# Patient Record
Sex: Male | Born: 1950 | Race: White | Hispanic: No | Marital: Married | Smoking: Never smoker
Health system: Southern US, Community
[De-identification: ages and names within clinical notes are randomized; demographics above are authoritative.]

## PROBLEM LIST (undated history)

## (undated) DIAGNOSIS — F419 Anxiety disorder, unspecified: Secondary | ICD-10-CM

## (undated) DIAGNOSIS — T7840XA Allergy, unspecified, initial encounter: Secondary | ICD-10-CM

## (undated) DIAGNOSIS — F32A Depression, unspecified: Secondary | ICD-10-CM

## (undated) DIAGNOSIS — F329 Major depressive disorder, single episode, unspecified: Secondary | ICD-10-CM

## (undated) DIAGNOSIS — M1990 Unspecified osteoarthritis, unspecified site: Secondary | ICD-10-CM

## (undated) HISTORY — DX: Allergy, unspecified, initial encounter: T78.40XA

## (undated) HISTORY — PX: COLONOSCOPY: SHX174

## (undated) HISTORY — DX: Unspecified osteoarthritis, unspecified site: M19.90

## (undated) HISTORY — DX: Anxiety disorder, unspecified: F41.9

## (undated) HISTORY — DX: Major depressive disorder, single episode, unspecified: F32.9

## (undated) HISTORY — DX: Depression, unspecified: F32.A

---

## 2000-09-03 ENCOUNTER — Encounter: Admission: RE | Admit: 2000-09-03 | Discharge: 2000-09-03 | Payer: Self-pay | Admitting: Family Medicine

## 2000-09-03 ENCOUNTER — Encounter: Payer: Self-pay | Admitting: Family Medicine

## 2006-08-02 ENCOUNTER — Encounter: Admission: RE | Admit: 2006-08-02 | Discharge: 2006-08-02 | Payer: Self-pay | Admitting: Family Medicine

## 2006-08-02 IMAGING — CR DG CHEST 2V
2 series · 2 of 2 positions shown · non-contrast
Comparison: Report of a prior chest x-ray [DATE].  The images are no longer available.

CLINICAL DATA: Cough/sore throat. 
 CHEST - 2 VIEW:

[view not recorded (1 of 2)]
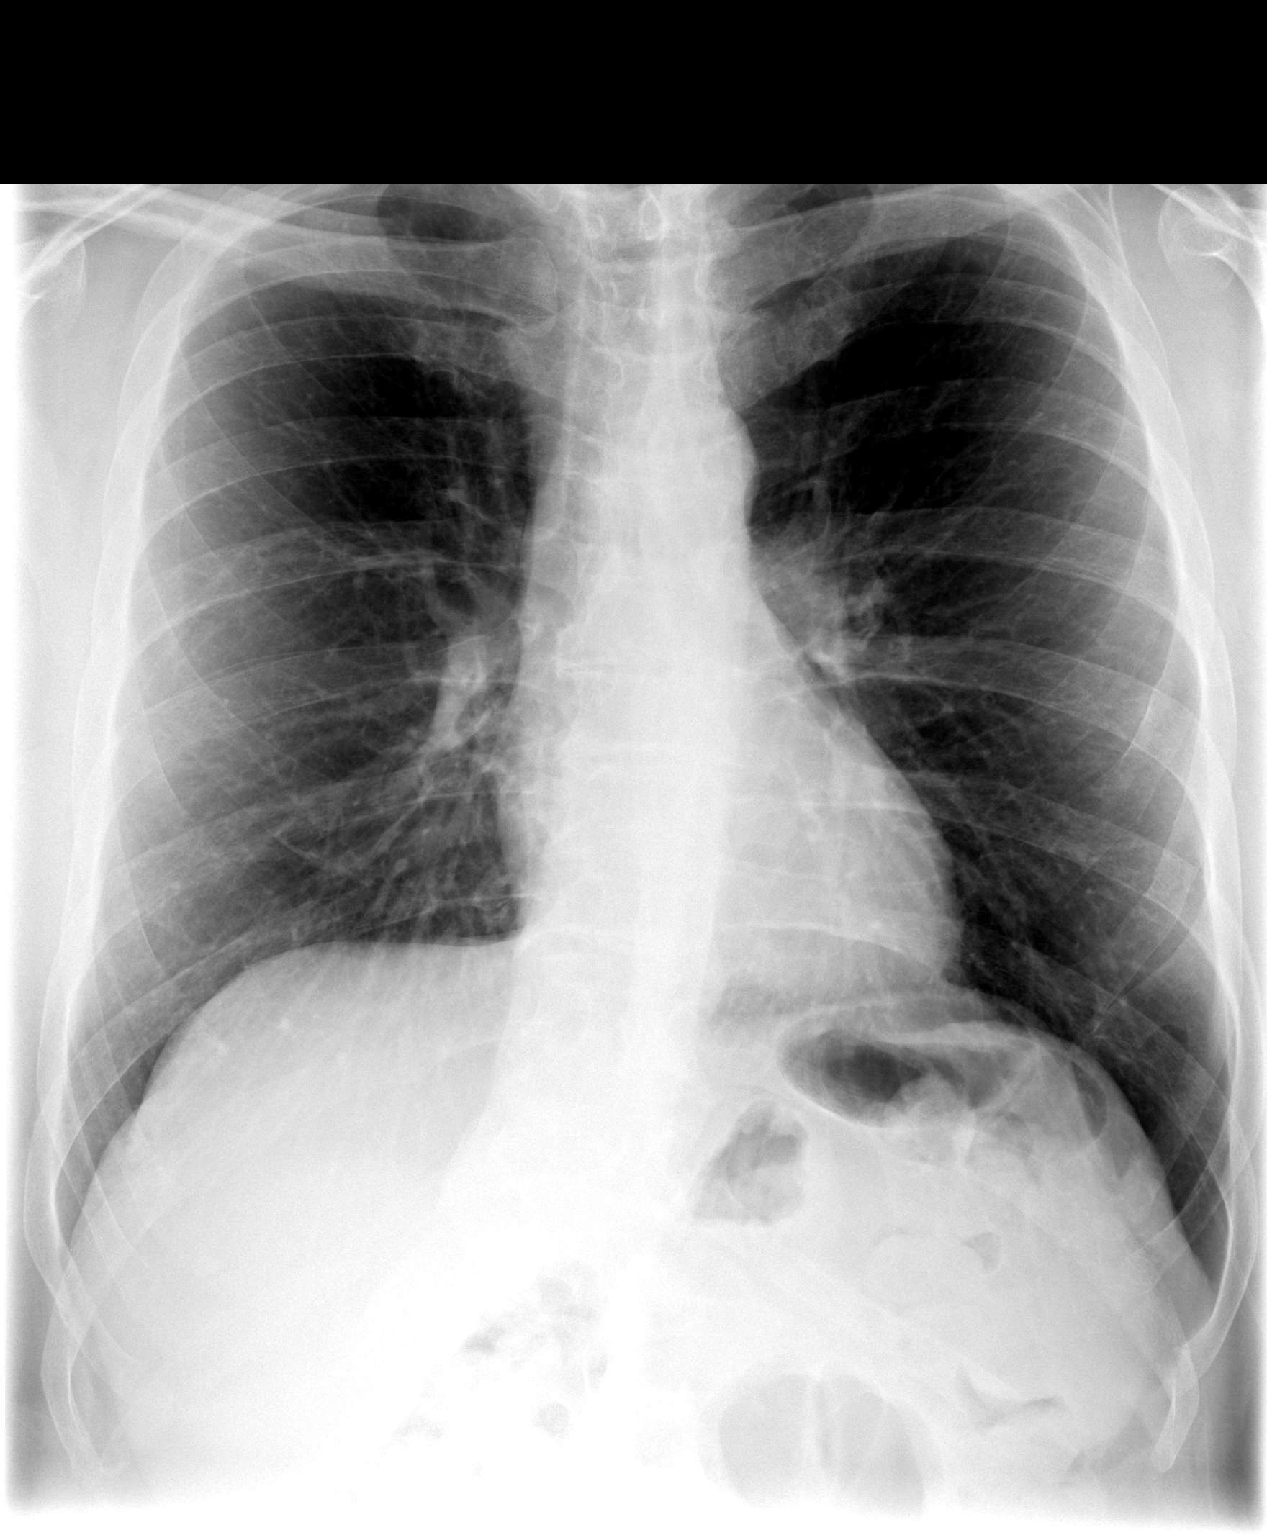

[view not recorded (2 of 2)]
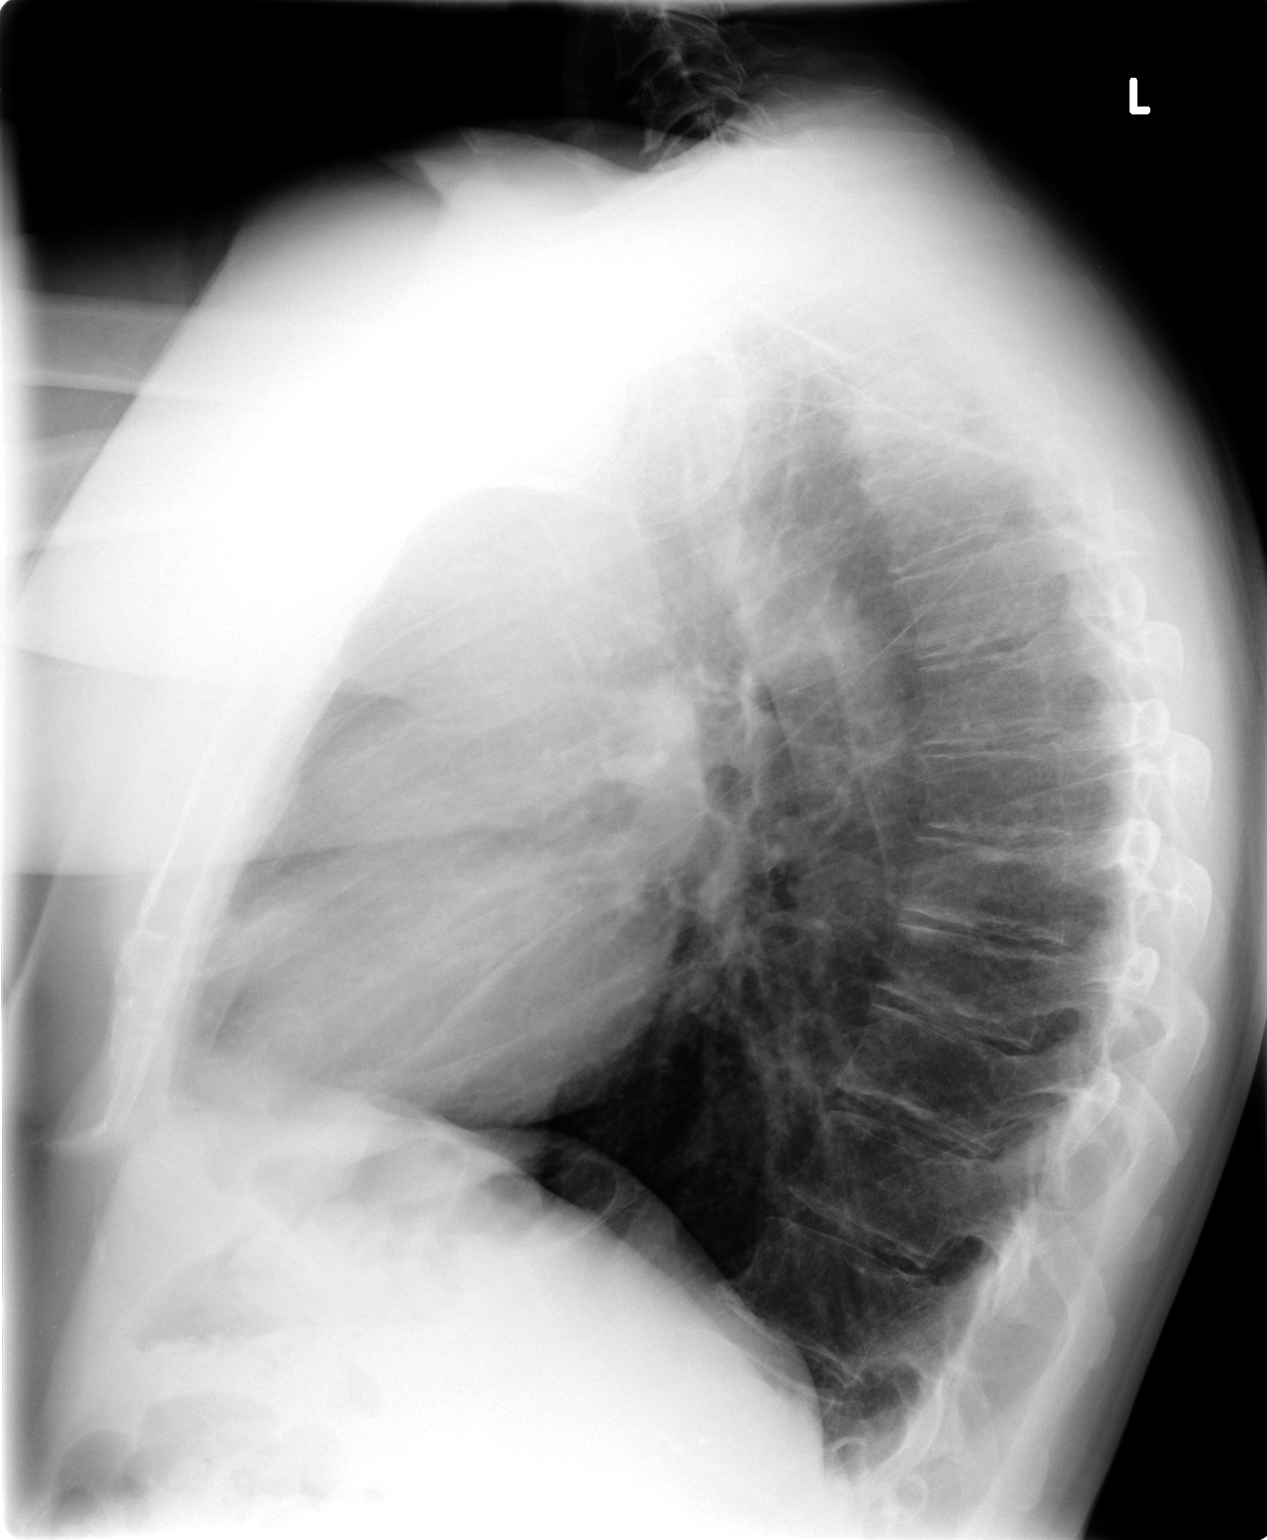

[2 of 2 positions shown; findings below may reference images not displayed]

FINDINGS: Heart and lungs normal.  Slight peribronchial thickening.  Moderate thoracolumbar scoliosis.
IMPRESSION: No active disease.

## 2009-05-10 ENCOUNTER — Encounter (INDEPENDENT_AMBULATORY_CARE_PROVIDER_SITE_OTHER): Payer: Self-pay | Admitting: *Deleted

## 2009-06-15 ENCOUNTER — Encounter (INDEPENDENT_AMBULATORY_CARE_PROVIDER_SITE_OTHER): Payer: Self-pay

## 2009-06-21 ENCOUNTER — Ambulatory Visit: Payer: Self-pay | Admitting: Gastroenterology

## 2009-07-04 ENCOUNTER — Ambulatory Visit: Payer: Self-pay | Admitting: Gastroenterology

## 2010-07-18 NOTE — Letter (Signed)
Summary: Previsit letter  [facility]  [address] [place], [place] [address]   Phone: [phone]  Fax: [phone]       05/10/2009 MRN: [id number]  Philip Baker [address] [place], [place]  [address]  Dear [name],  Welcome to the Gastroenterology Division at [facility].    You are scheduled to see a nurse for your pre-procedure visit on 06/21/2009 at 8:00AM on the 3rd floor at [facility], [address]  We ask that you try to arrive at our office 15 minutes prior to your appointment time to allow for check-in.  Your nurse visit will consist of discussing your medical and surgical history, your immediate family medical history, and your medications.    Please bring a complete list of all your medications or, if you prefer, bring the medication bottles and we will list them.  We will need to be aware of both prescribed and over the counter drugs.  We will need to know exact dosage information as well.  If you are on blood thinners (Coumadin, Plavix, Aggrenox, Ticlid, etc.) please call our office today/prior to your appointment, as we need to consult with your physician about holding your medication.   Please be prepared to read and sign documents such as consent forms, a financial agreement, and acknowledgement forms.  If necessary, and with your consent, a friend or relative is welcome to sit-in on the nurse visit with you.  Please bring your insurance card so that we may make a copy of it.  If your insurance requires a referral to see a specialist, please bring your referral form from your primary care physician.  No co-pay is required for this nurse visit.     If you cannot keep your appointment, please call [phone] to cancel or reschedule prior to your appointment date.  This allows [place] the opportunity to schedule an appointment for another patient in need of care.    Thank you for choosing [facility] for your medical needs.   We appreciate the opportunity to care for you.  Please visit [place] at our website  to learn more about our practice.                     Sincerely.                                                                                                                   The Gastroenterology Division

## 2010-07-18 NOTE — Miscellaneous (Signed)
Summary: Lec previsit  Clinical Lists Changes  Medications: Added new medication of MOVIPREP 100 GM  SOLR (PEG-KCL-NACL-NASULF-NA ASC-C) As per prep instructions. - Signed Rx of MOVIPREP 100 GM  SOLR (PEG-KCL-NACL-NASULF-NA ASC-C) As per prep instructions.;  #1 x 0;  Signed;  Entered by: Ulis Rias RN;  Authorized by: Mardella Layman MD [facility];  Method used: Electronically to [facility] [address] [place], [place], [place]  [address], Ph: [phone], Fax: [phone] Observations: Added new observation of NKA: T (06/21/2009 7:44)    Prescriptions: MOVIPREP 100 GM  SOLR (PEG-KCL-NACL-NASULF-NA ASC-C) As per prep instructions.  #1 x 0   Entered by:   Ulis Rias RN   Authorized by:   Mardella Layman MD [facility]   Signed by:   Ulis Rias RN on 06/21/2009   Method used:   Electronically to        [facility]       [address]       [place], [place]  [address]       Ph: [phone]       Fax: [phone]   RxID:   [id number]

## 2010-07-18 NOTE — Letter (Signed)
Summary: [facility] Instructions  [facility]  [address] [place], [place] [address]   Phone: [phone]  Fax: [phone]       Philip Baker    [birth year]    MRN: [id number]        Procedure Day /Date:  Monday 07-04-2009     Arrival Time: 8:00 am      Procedure Time:  9:00 am     Location of Procedure:                    _x _  [facility] (4th Floor)   PREPARATION FOR COLONOSCOPY WITH MOVIPREP   Starting 5 days prior to your procedure Wednesday 1-12 do not eat nuts, seeds, popcorn, corn, beans, peas,  salads, or any raw vegetables.  Do not take any fiber supplements (e.g. Metamucil, Citrucel, and Benefiber).  THE DAY BEFORE YOUR PROCEDURE         DATE: Sunday 1-16 1.  Drink clear liquids the entire day-NO SOLID FOOD  2.  Do not drink anything colored red or purple.  Avoid juices with pulp.  No orange juice.  3.  Drink at least 64 oz. (8 glasses) of fluid/clear liquids during the day to prevent dehydration and help the prep work efficiently.  CLEAR LIQUIDS INCLUDE: Water Jello Ice Popsicles Tea (sugar ok, no milk/cream) Powdered fruit flavored drinks Coffee (sugar ok, no milk/cream) Gatorade Juice: apple, white grape, white cranberry  Lemonade Clear bullion, consomm, broth Carbonated beverages (any kind) Strained chicken noodle soup Hard Candy                             4.  In the morning, mix first dose of MoviPrep solution:    Empty 1 Pouch A and 1 Pouch B into the disposable container    Add lukewarm drinking water to the top line of the container. Mix to dissolve    Refrigerate (mixed solution should be used within 24 hrs)  5.  Begin drinking the prep at 5:00 p.m. The MoviPrep container is divided by 4 marks.   Every 15 minutes drink the solution down to the next mark (approximately 8 oz) until the full liter is complete.   6.  Follow completed prep with 16 oz of clear liquid of your choice (Nothing red or purple).  Continue  to drink clear liquids until bedtime.  7.  Before going to bed, mix second dose of MoviPrep solution:    Empty 1 Pouch A and 1 Pouch B into the disposable container    Add lukewarm drinking water to the top line of the container. Mix to dissolve    Refrigerate  THE DAY OF YOUR PROCEDURE      DATE: Monday 1-17  Beginning at 4:00 am (5 hours before procedure):         1. Every 15 minutes, drink the solution down to the next mark (approx 8 oz) until the full liter is complete.  2. Follow completed prep with 16 oz. of clear liquid of your choice.    3. You may drink clear liquids until 7:00 am (2 HOURS BEFORE PROCEDURE).   MEDICATION INSTRUCTIONS  Unless otherwise instructed, you should take regular prescription medications with a small sip of water   as early as possible the morning of your procedure.         OTHER INSTRUCTIONS  You will need a responsible adult at least  62 years of age to accompany you and drive you home.   This person must remain in the waiting room during your procedure.  Wear loose fitting clothing that is easily removed.  Leave jewelry and other valuables at home.  However, you may wish to bring a book to read or  an iPod/MP3 player to listen to music as you wait for your procedure to start.  Remove all body piercing jewelry and leave at home.  Total time from sign-in until discharge is approximately 2-3 hours.  You should go home directly after your procedure and rest.  You can resume normal activities the  day after your procedure.  The day of your procedure you should not:   Drive   Make legal decisions   Operate machinery   Drink alcohol   Return to work  You will receive specific instructions about eating, activities and medications before you leave.    The above instructions have been reviewed and explained to me by   Ulis Rias RN  June 21, 2009 7:57 AM     I fully understand and can verbalize these instructions  _____________________________ Date _________

## 2010-07-18 NOTE — Procedures (Signed)
Summary: Colonoscopy  Patient: Philip Baker Note: All result statuses are Final unless otherwise noted.  Tests: (1) Colonoscopy (COL)   COL Colonoscopy           DONE      [facility]     [address]     [place], [place]  [address]           COLONOSCOPY PROCEDURE REPORT           PATIENT:  Philip Baker, Philip Baker  MR#:  [id number]     BIRTHDATE:  February 07, 1951, 58 yrs. old  GENDER:  male           ENDOSCOPIST:  Kentaro Alewine. Jarold Motto, MD, [facility]     Referred by:  Chilton Greathouse, M.D.           PROCEDURE DATE:  07/04/2009     PROCEDURE:  Colonoscopy, Diagnostic     ASA CLASS:  Class II     INDICATIONS:  Routine Risk Screening           MEDICATIONS:   Fentanyl 50 mcg IV, Versed 7 mg IV           DESCRIPTION OF PROCEDURE:   After the risks benefits and     alternatives of the procedure were thoroughly explained, informed     consent was obtained.  Digital rectal exam was performed and     revealed no abnormalities.   The LB CF-[id number] endoscope     was introduced through the anus and advanced to the cecum, which     was identified by both the appendix and ileocecal valve, without     limitations.  The quality of the prep was excellent, using     MoviPrep.  The instrument was then slowly withdrawn as the colon     was fully examined.     <<PROCEDUREIMAGES>>           FINDINGS:  No polyps or cancers were seen.  This was otherwise a     normal examination of the colon.   Retroflexed views in the rectum     revealed no abnormalities.    The scope was then withdrawn from     the patient and the procedure completed.           COMPLICATIONS:  None           ENDOSCOPIC IMPRESSION:     1) No polyps or cancers     2) Otherwise normal examination     RECOMMENDATIONS:     1) Continue current colorectal screening recommendations for     "routine risk" patients with a repeat colonoscopy in 10 years.           REPEAT EXAM:  No           ______________________________     Vania Rea.  Jarold Motto, MD, [facility]           CC:           n.     eSIGNED:   Vania Rea. Malea Swilling at 07/04/2009 09:29 AM           [place] Shi, [id number]  Note: An exclamation mark (!) indicates a result that was not dispersed into the flowsheet. Document Creation Date: 07/04/2009 9:26 AM _______________________________________________________________________  (1) Order result status: Final Collection or observation date-time: 07/04/2009 09:23 Requested date-time:  Receipt date-time:  Reported date-time:  Referring Physician:   Ordering Physician: Sheryn Bison [phone]) Specimen Source:  Source: EndoProS  Filler Order Number: [id number] Lab site:   Appended Document: Colonoscopy    Clinical Lists Changes  Observations: Added new observation of COLONNXTDUE: 06/2019 (07/04/2009 15:36)      Appended Document: Colonoscopy     Procedures Next Due Date:    Colonoscopy: 07/2019

## 2012-05-03 ENCOUNTER — Ambulatory Visit (INDEPENDENT_AMBULATORY_CARE_PROVIDER_SITE_OTHER): Payer: BC Managed Care – PPO | Admitting: Physician Assistant

## 2012-05-03 VITALS — BP 123/72 | HR 65 | Temp 98.7°F | Resp 16 | Ht 71.0 in | Wt 192.0 lb

## 2012-05-03 DIAGNOSIS — J029 Acute pharyngitis, unspecified: Secondary | ICD-10-CM

## 2012-05-03 DIAGNOSIS — J329 Chronic sinusitis, unspecified: Secondary | ICD-10-CM

## 2012-05-03 MED ORDER — FLUTICASONE PROPIONATE 50 MCG/ACT NA SUSP: 2.0000 | Freq: Every day | NASAL | Status: DC

## 2012-05-03 MED ORDER — FIRST-DUKES MOUTHWASH MT SUSP: 5.0000 mL | Freq: Two times a day (BID) | OROMUCOSAL | Status: DC | PRN

## 2012-05-03 MED ORDER — AZITHROMYCIN 250 MG PO TABS: ORAL_TABLET | ORAL | Status: DC

## 2012-05-03 NOTE — Progress Notes (Signed)
  Subjective:    Patient ID: Philip Baker, male    DOB: [birth year], 61 y.o.   MRN: [id number]  HPI 61 year old male presents with 1 week history of nasal congestion, postnasal drainage, and progressively worsening sore throat. Has had some chills but no documented fever.  Denies otalgia, nausea, vomiting, headache, sinus pain, shortness of breath, or wheezing.  He has no other complaints today. He has been taking Claritin which has not seemed to help much.  His wife, sister-in-law, and brother-in-law all have similar illnesses that have resolved, but his has persisted.     Review of Systems  Constitutional: Positive for chills. Negative for fever.  HENT: Positive for congestion, sore throat, rhinorrhea and postnasal drip. Negative for neck pain.   Respiratory: Negative for cough, shortness of breath and wheezing.   Gastrointestinal: Negative for nausea and vomiting.  Skin: Negative for rash.  Neurological: Negative for headaches.  All other systems reviewed and are negative.       Objective:   Physical Exam  Constitutional: He is oriented to person, place, and time. He appears well-developed and well-nourished.  HENT:  Head: Normocephalic and atraumatic.  Right Ear: External ear normal.  Left Ear: External ear normal.  Mouth/Throat: Oropharynx is clear and moist. No oropharyngeal exudate (slight tonsillar erythema and clear postnasal drainage).  Eyes: Conjunctivae normal are normal.  Neck: Normal range of motion.  Cardiovascular: Normal rate, regular rhythm and normal heart sounds.   Pulmonary/Chest: Effort normal and breath sounds normal.  Lymphadenopathy:    He has no cervical adenopathy.  Neurological: He is alert and oriented to person, place, and time.  Psychiatric: He has a normal mood and affect. His behavior is normal. Judgment and thought content normal.          Assessment & Plan:   1. Sinusitis  azithromycin (ZITHROMAX) 250 MG tablet, fluticasone (FLONASE) 50  MCG/ACT nasal spray  2. Acute pharyngitis  Diphenhyd-Hydrocort-Nystatin (FIRST-DUKES MOUTHWASH) SUSP  Continue Claritin daily Start Zpack OTC Delsym prn cough Duke's mouthwash to help with throat pain Follow up if symptoms worsen or fail to improve

## 2014-01-04 DIAGNOSIS — Z85828 Personal history of other malignant neoplasm of skin: Secondary | ICD-10-CM | POA: Diagnosis not present

## 2014-01-04 DIAGNOSIS — M7130 Other bursal cyst, unspecified site: Secondary | ICD-10-CM | POA: Diagnosis not present

## 2014-01-04 DIAGNOSIS — L570 Actinic keratosis: Secondary | ICD-10-CM | POA: Diagnosis not present

## 2014-01-04 DIAGNOSIS — L821 Other seborrheic keratosis: Secondary | ICD-10-CM | POA: Diagnosis not present

## 2014-01-04 DIAGNOSIS — L819 Disorder of pigmentation, unspecified: Secondary | ICD-10-CM | POA: Diagnosis not present

## 2014-02-23 DIAGNOSIS — H6120 Impacted cerumen, unspecified ear: Secondary | ICD-10-CM | POA: Diagnosis not present

## 2014-03-19 DIAGNOSIS — Z23 Encounter for immunization: Secondary | ICD-10-CM | POA: Diagnosis not present

## 2014-05-26 DIAGNOSIS — E785 Hyperlipidemia, unspecified: Secondary | ICD-10-CM | POA: Diagnosis not present

## 2014-05-26 DIAGNOSIS — Z125 Encounter for screening for malignant neoplasm of prostate: Secondary | ICD-10-CM | POA: Diagnosis not present

## 2014-06-03 DIAGNOSIS — E785 Hyperlipidemia, unspecified: Secondary | ICD-10-CM | POA: Diagnosis not present

## 2014-06-03 DIAGNOSIS — Z6826 Body mass index (BMI) 26.0-26.9, adult: Secondary | ICD-10-CM | POA: Diagnosis not present

## 2014-06-03 DIAGNOSIS — Z23 Encounter for immunization: Secondary | ICD-10-CM | POA: Diagnosis not present

## 2014-06-03 DIAGNOSIS — Z1389 Encounter for screening for other disorder: Secondary | ICD-10-CM | POA: Diagnosis not present

## 2014-06-03 DIAGNOSIS — F329 Major depressive disorder, single episode, unspecified: Secondary | ICD-10-CM | POA: Diagnosis not present

## 2014-06-03 DIAGNOSIS — J309 Allergic rhinitis, unspecified: Secondary | ICD-10-CM | POA: Diagnosis not present

## 2014-06-03 DIAGNOSIS — Z1212 Encounter for screening for malignant neoplasm of rectum: Secondary | ICD-10-CM | POA: Diagnosis not present

## 2014-06-03 DIAGNOSIS — Z008 Encounter for other general examination: Secondary | ICD-10-CM | POA: Diagnosis not present

## 2014-09-21 DIAGNOSIS — Z6826 Body mass index (BMI) 26.0-26.9, adult: Secondary | ICD-10-CM | POA: Diagnosis not present

## 2014-09-21 DIAGNOSIS — M519 Unspecified thoracic, thoracolumbar and lumbosacral intervertebral disc disorder: Secondary | ICD-10-CM | POA: Diagnosis not present

## 2014-09-21 DIAGNOSIS — M47819 Spondylosis without myelopathy or radiculopathy, site unspecified: Secondary | ICD-10-CM | POA: Diagnosis not present

## 2014-09-21 DIAGNOSIS — M545 Low back pain: Secondary | ICD-10-CM | POA: Diagnosis not present

## 2015-03-05 DIAGNOSIS — Z23 Encounter for immunization: Secondary | ICD-10-CM | POA: Diagnosis not present

## 2015-04-20 ENCOUNTER — Encounter: Payer: Self-pay | Admitting: Gastroenterology

## 2015-06-01 DIAGNOSIS — M47816 Spondylosis without myelopathy or radiculopathy, lumbar region: Secondary | ICD-10-CM | POA: Diagnosis not present

## 2015-06-01 DIAGNOSIS — M4126 Other idiopathic scoliosis, lumbar region: Secondary | ICD-10-CM | POA: Diagnosis not present

## 2015-06-09 DIAGNOSIS — M545 Low back pain: Secondary | ICD-10-CM | POA: Diagnosis not present

## 2015-06-09 DIAGNOSIS — M6281 Muscle weakness (generalized): Secondary | ICD-10-CM | POA: Diagnosis not present

## 2015-06-15 DIAGNOSIS — M545 Low back pain: Secondary | ICD-10-CM | POA: Diagnosis not present

## 2015-06-15 DIAGNOSIS — M6281 Muscle weakness (generalized): Secondary | ICD-10-CM | POA: Diagnosis not present

## 2015-06-22 DIAGNOSIS — M545 Low back pain: Secondary | ICD-10-CM | POA: Diagnosis not present

## 2015-06-22 DIAGNOSIS — M6281 Muscle weakness (generalized): Secondary | ICD-10-CM | POA: Diagnosis not present

## 2015-06-24 DIAGNOSIS — M6281 Muscle weakness (generalized): Secondary | ICD-10-CM | POA: Diagnosis not present

## 2015-06-24 DIAGNOSIS — M545 Low back pain: Secondary | ICD-10-CM | POA: Diagnosis not present

## 2015-06-27 DIAGNOSIS — M545 Low back pain: Secondary | ICD-10-CM | POA: Diagnosis not present

## 2015-06-27 DIAGNOSIS — M6281 Muscle weakness (generalized): Secondary | ICD-10-CM | POA: Diagnosis not present

## 2015-06-28 DIAGNOSIS — D72819 Decreased white blood cell count, unspecified: Secondary | ICD-10-CM | POA: Diagnosis not present

## 2015-06-28 DIAGNOSIS — E784 Other hyperlipidemia: Secondary | ICD-10-CM | POA: Diagnosis not present

## 2015-06-28 DIAGNOSIS — Z125 Encounter for screening for malignant neoplasm of prostate: Secondary | ICD-10-CM | POA: Diagnosis not present

## 2015-06-29 DIAGNOSIS — M6281 Muscle weakness (generalized): Secondary | ICD-10-CM | POA: Diagnosis not present

## 2015-06-29 DIAGNOSIS — M545 Low back pain: Secondary | ICD-10-CM | POA: Diagnosis not present

## 2015-07-04 DIAGNOSIS — Z6826 Body mass index (BMI) 26.0-26.9, adult: Secondary | ICD-10-CM | POA: Diagnosis not present

## 2015-07-04 DIAGNOSIS — F329 Major depressive disorder, single episode, unspecified: Secondary | ICD-10-CM | POA: Diagnosis not present

## 2015-07-04 DIAGNOSIS — Z23 Encounter for immunization: Secondary | ICD-10-CM | POA: Diagnosis not present

## 2015-07-04 DIAGNOSIS — Z0000 Encounter for general adult medical examination without abnormal findings: Secondary | ICD-10-CM | POA: Diagnosis not present

## 2015-07-04 DIAGNOSIS — J309 Allergic rhinitis, unspecified: Secondary | ICD-10-CM | POA: Diagnosis not present

## 2015-07-04 DIAGNOSIS — N529 Male erectile dysfunction, unspecified: Secondary | ICD-10-CM | POA: Diagnosis not present

## 2015-07-04 DIAGNOSIS — M545 Low back pain: Secondary | ICD-10-CM | POA: Diagnosis not present

## 2015-07-04 DIAGNOSIS — Z1389 Encounter for screening for other disorder: Secondary | ICD-10-CM | POA: Diagnosis not present

## 2015-07-04 DIAGNOSIS — Z1212 Encounter for screening for malignant neoplasm of rectum: Secondary | ICD-10-CM | POA: Diagnosis not present

## 2015-07-04 DIAGNOSIS — E784 Other hyperlipidemia: Secondary | ICD-10-CM | POA: Diagnosis not present

## 2015-07-06 DIAGNOSIS — M545 Low back pain: Secondary | ICD-10-CM | POA: Diagnosis not present

## 2015-07-06 DIAGNOSIS — M6281 Muscle weakness (generalized): Secondary | ICD-10-CM | POA: Diagnosis not present

## 2015-07-07 DIAGNOSIS — H2513 Age-related nuclear cataract, bilateral: Secondary | ICD-10-CM | POA: Diagnosis not present

## 2015-07-11 DIAGNOSIS — M545 Low back pain: Secondary | ICD-10-CM | POA: Diagnosis not present

## 2015-07-11 DIAGNOSIS — M6281 Muscle weakness (generalized): Secondary | ICD-10-CM | POA: Diagnosis not present

## 2015-07-14 DIAGNOSIS — M4126 Other idiopathic scoliosis, lumbar region: Secondary | ICD-10-CM | POA: Diagnosis not present

## 2015-07-14 DIAGNOSIS — M47816 Spondylosis without myelopathy or radiculopathy, lumbar region: Secondary | ICD-10-CM | POA: Diagnosis not present

## 2015-07-20 DIAGNOSIS — M6281 Muscle weakness (generalized): Secondary | ICD-10-CM | POA: Diagnosis not present

## 2015-07-20 DIAGNOSIS — M545 Low back pain: Secondary | ICD-10-CM | POA: Diagnosis not present

## 2015-07-29 DIAGNOSIS — M545 Low back pain: Secondary | ICD-10-CM | POA: Diagnosis not present

## 2015-07-29 DIAGNOSIS — M6281 Muscle weakness (generalized): Secondary | ICD-10-CM | POA: Diagnosis not present

## 2015-09-11 DIAGNOSIS — H6503 Acute serous otitis media, bilateral: Secondary | ICD-10-CM | POA: Diagnosis not present

## 2015-12-05 DIAGNOSIS — M25529 Pain in unspecified elbow: Secondary | ICD-10-CM | POA: Diagnosis not present

## 2015-12-05 DIAGNOSIS — Z6825 Body mass index (BMI) 25.0-25.9, adult: Secondary | ICD-10-CM | POA: Diagnosis not present

## 2015-12-12 DIAGNOSIS — M19021 Primary osteoarthritis, right elbow: Secondary | ICD-10-CM | POA: Diagnosis not present

## 2015-12-12 DIAGNOSIS — M25521 Pain in right elbow: Secondary | ICD-10-CM | POA: Diagnosis not present

## 2016-03-03 DIAGNOSIS — Z23 Encounter for immunization: Secondary | ICD-10-CM | POA: Diagnosis not present

## 2016-06-29 DIAGNOSIS — E784 Other hyperlipidemia: Secondary | ICD-10-CM | POA: Diagnosis not present

## 2016-06-29 DIAGNOSIS — Z125 Encounter for screening for malignant neoplasm of prostate: Secondary | ICD-10-CM | POA: Diagnosis not present

## 2016-06-29 DIAGNOSIS — Z0000 Encounter for general adult medical examination without abnormal findings: Secondary | ICD-10-CM | POA: Diagnosis not present

## 2016-07-06 DIAGNOSIS — E784 Other hyperlipidemia: Secondary | ICD-10-CM | POA: Diagnosis not present

## 2016-07-06 DIAGNOSIS — Z0000 Encounter for general adult medical examination without abnormal findings: Secondary | ICD-10-CM | POA: Diagnosis not present

## 2016-07-06 DIAGNOSIS — J309 Allergic rhinitis, unspecified: Secondary | ICD-10-CM | POA: Diagnosis not present

## 2016-07-06 DIAGNOSIS — F329 Major depressive disorder, single episode, unspecified: Secondary | ICD-10-CM | POA: Diagnosis not present

## 2016-07-06 DIAGNOSIS — M545 Low back pain: Secondary | ICD-10-CM | POA: Diagnosis not present

## 2016-07-06 DIAGNOSIS — N529 Male erectile dysfunction, unspecified: Secondary | ICD-10-CM | POA: Diagnosis not present

## 2016-07-06 DIAGNOSIS — Z6825 Body mass index (BMI) 25.0-25.9, adult: Secondary | ICD-10-CM | POA: Diagnosis not present

## 2016-07-06 DIAGNOSIS — Z1389 Encounter for screening for other disorder: Secondary | ICD-10-CM | POA: Diagnosis not present

## 2016-07-06 DIAGNOSIS — D72819 Decreased white blood cell count, unspecified: Secondary | ICD-10-CM | POA: Diagnosis not present

## 2016-07-06 DIAGNOSIS — M25529 Pain in unspecified elbow: Secondary | ICD-10-CM | POA: Diagnosis not present

## 2016-07-09 DIAGNOSIS — H25042 Posterior subcapsular polar age-related cataract, left eye: Secondary | ICD-10-CM | POA: Diagnosis not present

## 2016-07-23 DIAGNOSIS — Z1212 Encounter for screening for malignant neoplasm of rectum: Secondary | ICD-10-CM | POA: Diagnosis not present

## 2016-10-08 DIAGNOSIS — H43811 Vitreous degeneration, right eye: Secondary | ICD-10-CM | POA: Diagnosis not present

## 2016-10-08 DIAGNOSIS — H2513 Age-related nuclear cataract, bilateral: Secondary | ICD-10-CM | POA: Diagnosis not present

## 2016-10-16 DIAGNOSIS — Z85828 Personal history of other malignant neoplasm of skin: Secondary | ICD-10-CM | POA: Diagnosis not present

## 2016-10-16 DIAGNOSIS — L570 Actinic keratosis: Secondary | ICD-10-CM | POA: Diagnosis not present

## 2016-10-16 DIAGNOSIS — L821 Other seborrheic keratosis: Secondary | ICD-10-CM | POA: Diagnosis not present

## 2016-10-16 DIAGNOSIS — M7130 Other bursal cyst, unspecified site: Secondary | ICD-10-CM | POA: Diagnosis not present

## 2017-03-02 DIAGNOSIS — Z23 Encounter for immunization: Secondary | ICD-10-CM | POA: Diagnosis not present

## 2017-04-08 DIAGNOSIS — H2513 Age-related nuclear cataract, bilateral: Secondary | ICD-10-CM | POA: Diagnosis not present

## 2017-04-08 DIAGNOSIS — H40013 Open angle with borderline findings, low risk, bilateral: Secondary | ICD-10-CM | POA: Diagnosis not present

## 2017-07-15 DIAGNOSIS — R82998 Other abnormal findings in urine: Secondary | ICD-10-CM | POA: Diagnosis not present

## 2017-07-15 DIAGNOSIS — Z125 Encounter for screening for malignant neoplasm of prostate: Secondary | ICD-10-CM | POA: Diagnosis not present

## 2017-07-15 DIAGNOSIS — E7849 Other hyperlipidemia: Secondary | ICD-10-CM | POA: Diagnosis not present

## 2017-07-22 DIAGNOSIS — Z1389 Encounter for screening for other disorder: Secondary | ICD-10-CM | POA: Diagnosis not present

## 2017-07-22 DIAGNOSIS — Z1212 Encounter for screening for malignant neoplasm of rectum: Secondary | ICD-10-CM | POA: Diagnosis not present

## 2017-07-22 DIAGNOSIS — Z6824 Body mass index (BMI) 24.0-24.9, adult: Secondary | ICD-10-CM | POA: Diagnosis not present

## 2017-07-22 DIAGNOSIS — F3289 Other specified depressive episodes: Secondary | ICD-10-CM | POA: Diagnosis not present

## 2017-07-22 DIAGNOSIS — Z0000 Encounter for general adult medical examination without abnormal findings: Secondary | ICD-10-CM | POA: Diagnosis not present

## 2017-07-22 DIAGNOSIS — E7849 Other hyperlipidemia: Secondary | ICD-10-CM | POA: Diagnosis not present

## 2017-07-22 DIAGNOSIS — D72819 Decreased white blood cell count, unspecified: Secondary | ICD-10-CM | POA: Diagnosis not present

## 2017-07-22 DIAGNOSIS — J3089 Other allergic rhinitis: Secondary | ICD-10-CM | POA: Diagnosis not present

## 2017-10-07 DIAGNOSIS — H2513 Age-related nuclear cataract, bilateral: Secondary | ICD-10-CM | POA: Diagnosis not present

## 2017-10-17 DIAGNOSIS — H2513 Age-related nuclear cataract, bilateral: Secondary | ICD-10-CM | POA: Diagnosis not present

## 2017-10-18 DIAGNOSIS — H1013 Acute atopic conjunctivitis, bilateral: Secondary | ICD-10-CM | POA: Diagnosis not present

## 2017-10-29 DIAGNOSIS — H25812 Combined forms of age-related cataract, left eye: Secondary | ICD-10-CM | POA: Diagnosis not present

## 2017-10-29 DIAGNOSIS — H2512 Age-related nuclear cataract, left eye: Secondary | ICD-10-CM | POA: Diagnosis not present

## 2017-12-18 DIAGNOSIS — H2511 Age-related nuclear cataract, right eye: Secondary | ICD-10-CM | POA: Diagnosis not present

## 2018-01-02 DIAGNOSIS — Z961 Presence of intraocular lens: Secondary | ICD-10-CM | POA: Diagnosis not present

## 2018-03-22 DIAGNOSIS — Z23 Encounter for immunization: Secondary | ICD-10-CM | POA: Diagnosis not present

## 2018-03-26 DIAGNOSIS — Z6824 Body mass index (BMI) 24.0-24.9, adult: Secondary | ICD-10-CM | POA: Diagnosis not present

## 2018-03-26 DIAGNOSIS — R05 Cough: Secondary | ICD-10-CM | POA: Diagnosis not present

## 2018-03-26 DIAGNOSIS — J309 Allergic rhinitis, unspecified: Secondary | ICD-10-CM | POA: Diagnosis not present

## 2018-06-25 DIAGNOSIS — H2511 Age-related nuclear cataract, right eye: Secondary | ICD-10-CM | POA: Diagnosis not present

## 2018-08-04 DIAGNOSIS — R82998 Other abnormal findings in urine: Secondary | ICD-10-CM | POA: Diagnosis not present

## 2018-08-04 DIAGNOSIS — E7849 Other hyperlipidemia: Secondary | ICD-10-CM | POA: Diagnosis not present

## 2018-08-04 DIAGNOSIS — Z125 Encounter for screening for malignant neoplasm of prostate: Secondary | ICD-10-CM | POA: Diagnosis not present

## 2018-08-11 DIAGNOSIS — F329 Major depressive disorder, single episode, unspecified: Secondary | ICD-10-CM | POA: Diagnosis not present

## 2018-08-11 DIAGNOSIS — E7849 Other hyperlipidemia: Secondary | ICD-10-CM | POA: Diagnosis not present

## 2018-08-11 DIAGNOSIS — J3089 Other allergic rhinitis: Secondary | ICD-10-CM | POA: Diagnosis not present

## 2018-08-11 DIAGNOSIS — Z6824 Body mass index (BMI) 24.0-24.9, adult: Secondary | ICD-10-CM | POA: Diagnosis not present

## 2018-08-11 DIAGNOSIS — Z0000 Encounter for general adult medical examination without abnormal findings: Secondary | ICD-10-CM | POA: Diagnosis not present

## 2018-08-11 DIAGNOSIS — D72819 Decreased white blood cell count, unspecified: Secondary | ICD-10-CM | POA: Diagnosis not present

## 2018-08-11 DIAGNOSIS — H6123 Impacted cerumen, bilateral: Secondary | ICD-10-CM | POA: Diagnosis not present

## 2018-08-11 DIAGNOSIS — Z1331 Encounter for screening for depression: Secondary | ICD-10-CM | POA: Diagnosis not present

## 2018-08-12 DIAGNOSIS — Z1212 Encounter for screening for malignant neoplasm of rectum: Secondary | ICD-10-CM | POA: Diagnosis not present

## 2019-02-28 DIAGNOSIS — Z23 Encounter for immunization: Secondary | ICD-10-CM | POA: Diagnosis not present

## 2019-07-19 ENCOUNTER — Ambulatory Visit: Payer: Self-pay

## 2019-07-25 ENCOUNTER — Ambulatory Visit: Payer: PPO | Attending: Internal Medicine

## 2019-07-25 DIAGNOSIS — Z23 Encounter for immunization: Secondary | ICD-10-CM

## 2019-07-25 NOTE — Progress Notes (Signed)
   Covid-19 Vaccination Clinic  Name:  Philip Baker    MRN: [id number] DOB: 1950-12-09  07/25/2019  Mr. Franchina was observed post Covid-19 immunization for 15 minutes without incidence. He was provided with Vaccine Information Sheet and instruction to access the V-Safe system.   Mr. Bowder was instructed to call 911 with any severe reactions post vaccine: Marland Kitchen Difficulty breathing  . Swelling of your face and throat  . A fast heartbeat  . A bad rash all over your body  . Dizziness and weakness    Immunizations Administered    Name Date Dose VIS Date Route   Pfizer COVID-19 Vaccine 07/25/2019  8:17 AM 0.3 mL 05/29/2019 Intramuscular   Manufacturer: [place]   Lot: [id number]   [place]: [id number]

## 2019-07-30 ENCOUNTER — Ambulatory Visit: Payer: Self-pay

## 2019-08-18 ENCOUNTER — Ambulatory Visit: Payer: PPO | Attending: Internal Medicine

## 2019-08-18 DIAGNOSIS — Z23 Encounter for immunization: Secondary | ICD-10-CM | POA: Insufficient documentation

## 2019-08-18 NOTE — Progress Notes (Signed)
   Covid-19 Vaccination Clinic  Name:  Philip Baker    MRN: [id number] DOB: 01/15/1951  08/18/2019  [name] was observed post Covid-19 immunization for 15 minutes without incident. He was provided with Vaccine Information Sheet and instruction to access the V-Safe system.   [name] was instructed to call 911 with any severe reactions post vaccine: Marland Kitchen Difficulty breathing  . Swelling of face and throat  . A fast heartbeat  . A bad rash all over body  . Dizziness and weakness   Immunizations Administered    Name Date Dose VIS Date Route   Pfizer COVID-19 Vaccine 08/18/2019  8:30 AM 0.3 mL 05/29/2019 Intramuscular   Manufacturer: [place]   Lot: [id number]   [place]: [id number]

## 2019-08-26 DIAGNOSIS — Z125 Encounter for screening for malignant neoplasm of prostate: Secondary | ICD-10-CM | POA: Diagnosis not present

## 2019-08-26 DIAGNOSIS — E7849 Other hyperlipidemia: Secondary | ICD-10-CM | POA: Diagnosis not present

## 2019-09-02 DIAGNOSIS — Z0000 Encounter for general adult medical examination without abnormal findings: Secondary | ICD-10-CM | POA: Diagnosis not present

## 2019-09-02 DIAGNOSIS — Z1331 Encounter for screening for depression: Secondary | ICD-10-CM | POA: Diagnosis not present

## 2019-09-02 DIAGNOSIS — G4700 Insomnia, unspecified: Secondary | ICD-10-CM | POA: Diagnosis not present

## 2019-09-02 DIAGNOSIS — D72819 Decreased white blood cell count, unspecified: Secondary | ICD-10-CM | POA: Diagnosis not present

## 2019-09-02 DIAGNOSIS — R82998 Other abnormal findings in urine: Secondary | ICD-10-CM | POA: Diagnosis not present

## 2019-09-02 DIAGNOSIS — J309 Allergic rhinitis, unspecified: Secondary | ICD-10-CM | POA: Diagnosis not present

## 2019-09-02 DIAGNOSIS — F329 Major depressive disorder, single episode, unspecified: Secondary | ICD-10-CM | POA: Diagnosis not present

## 2019-09-02 DIAGNOSIS — E785 Hyperlipidemia, unspecified: Secondary | ICD-10-CM | POA: Diagnosis not present

## 2019-09-11 DIAGNOSIS — Z1212 Encounter for screening for malignant neoplasm of rectum: Secondary | ICD-10-CM | POA: Diagnosis not present

## 2019-10-21 ENCOUNTER — Encounter: Payer: Self-pay | Admitting: Gastroenterology

## 2019-11-12 DIAGNOSIS — L218 Other seborrheic dermatitis: Secondary | ICD-10-CM | POA: Diagnosis not present

## 2019-11-12 DIAGNOSIS — L570 Actinic keratosis: Secondary | ICD-10-CM | POA: Diagnosis not present

## 2019-11-12 DIAGNOSIS — D1801 Hemangioma of skin and subcutaneous tissue: Secondary | ICD-10-CM | POA: Diagnosis not present

## 2019-11-12 DIAGNOSIS — L812 Freckles: Secondary | ICD-10-CM | POA: Diagnosis not present

## 2019-11-12 DIAGNOSIS — Z85828 Personal history of other malignant neoplasm of skin: Secondary | ICD-10-CM | POA: Diagnosis not present

## 2019-11-12 DIAGNOSIS — L821 Other seborrheic keratosis: Secondary | ICD-10-CM | POA: Diagnosis not present

## 2019-11-23 ENCOUNTER — Ambulatory Visit (AMBULATORY_SURGERY_CENTER): Payer: Self-pay | Admitting: *Deleted

## 2019-11-23 ENCOUNTER — Other Ambulatory Visit: Payer: Self-pay

## 2019-11-23 ENCOUNTER — Encounter: Payer: Self-pay | Admitting: Gastroenterology

## 2019-11-23 VITALS — Ht 72.0 in | Wt 176.0 lb

## 2019-11-23 DIAGNOSIS — Z1211 Encounter for screening for malignant neoplasm of colon: Secondary | ICD-10-CM

## 2019-11-23 NOTE — Progress Notes (Signed)

## 2019-12-08 ENCOUNTER — Encounter: Payer: Self-pay | Admitting: Gastroenterology

## 2019-12-08 ENCOUNTER — Other Ambulatory Visit: Payer: Self-pay

## 2019-12-08 ENCOUNTER — Telehealth: Payer: Self-pay | Admitting: Gastroenterology

## 2019-12-08 ENCOUNTER — Ambulatory Visit: Payer: PPO | Admitting: Gastroenterology

## 2019-12-08 VITALS — BP 148/88 | HR 61 | Temp 97.3°F | Ht 72.0 in | Wt 176.0 lb

## 2019-12-08 MED ORDER — FLEET ENEMA 7-19 GM/118ML RE ENEM
1.0000 | ENEMA | Freq: Once | RECTAL | Status: AC
  Administered 2019-12-08: 1 via RECTAL

## 2019-12-08 MED ORDER — SODIUM CHLORIDE 0.9 % IV SOLN: 500.0000 mL | Freq: Once | INTRAVENOUS | Status: DC

## 2019-12-08 NOTE — Telephone Encounter (Signed)
Spoke with pt. States he took first dose of Miralax yesterday at 5pm and did not have a bowel movement until 345am. Last BM was about 6am and was yellow with some dark Pense pieces. Drank second dose at 8am and has been drinking a lot of water. He has not had another BM since about 6am. Worried the prep is not adequate. Procedure time is for 1:30, NPO time is 1030a. Please advise.

## 2019-12-08 NOTE — Progress Notes (Signed)
Enema given per order-results is [place] liquid with soft stool. Doctor notified via phone and he wants to schedule pt. At his conv.pt.decided that he will call back to reschedule. plenvu sample given to pt. Will need 2 day prep.

## 2019-12-08 NOTE — Telephone Encounter (Signed)
As discussed over the phone, if the patient wants to come in for an enema prior to procedure to see if this will help clear out the rest of his colon, I am okay with proceeding with attempt.  As long as he understands that there is a chance we may need to reschedule if he is still full of stool.  Otherwise the patient can be rescheduled.  He would need 1 week of MiraLAX daily prior to the procedure and a different preparation for the procedure.  Or a 2-day preparation can be done if he has a history of constipation. Thanks. GM

## 2019-12-22 DIAGNOSIS — R05 Cough: Secondary | ICD-10-CM | POA: Diagnosis not present

## 2019-12-22 DIAGNOSIS — D72819 Decreased white blood cell count, unspecified: Secondary | ICD-10-CM | POA: Diagnosis not present

## 2019-12-22 DIAGNOSIS — J309 Allergic rhinitis, unspecified: Secondary | ICD-10-CM | POA: Diagnosis not present

## 2019-12-22 DIAGNOSIS — S22000A Wedge compression fracture of unspecified thoracic vertebra, initial encounter for closed fracture: Secondary | ICD-10-CM | POA: Diagnosis not present

## 2019-12-23 ENCOUNTER — Other Ambulatory Visit: Payer: Self-pay | Admitting: Internal Medicine

## 2019-12-23 DIAGNOSIS — S22008A Other fracture of unspecified thoracic vertebra, initial encounter for closed fracture: Secondary | ICD-10-CM

## 2020-01-05 ENCOUNTER — Other Ambulatory Visit: Payer: Self-pay

## 2020-01-05 ENCOUNTER — Ambulatory Visit (HOSPITAL_COMMUNITY)
Admission: RE | Admit: 2020-01-05 | Discharge: 2020-01-05 | Disposition: A | Discharge status: Home / Self Care | Payer: PPO | Source: Ambulatory Visit | Attending: Internal Medicine | Admitting: Internal Medicine

## 2020-01-05 DIAGNOSIS — S22060A Wedge compression fracture of T7-T8 vertebra, initial encounter for closed fracture: Secondary | ICD-10-CM | POA: Diagnosis not present

## 2020-01-05 DIAGNOSIS — M4804 Spinal stenosis, thoracic region: Secondary | ICD-10-CM | POA: Diagnosis not present

## 2020-01-05 DIAGNOSIS — S22008A Other fracture of unspecified thoracic vertebra, initial encounter for closed fracture: Secondary | ICD-10-CM | POA: Diagnosis not present

## 2020-01-05 DIAGNOSIS — M2578 Osteophyte, vertebrae: Secondary | ICD-10-CM | POA: Diagnosis not present

## 2020-01-05 DIAGNOSIS — M47814 Spondylosis without myelopathy or radiculopathy, thoracic region: Secondary | ICD-10-CM | POA: Diagnosis not present

## 2020-02-29 ENCOUNTER — Other Ambulatory Visit: Payer: Self-pay | Admitting: Internal Medicine

## 2020-02-29 DIAGNOSIS — S22008A Other fracture of unspecified thoracic vertebra, initial encounter for closed fracture: Secondary | ICD-10-CM

## 2020-02-29 DIAGNOSIS — M1990 Unspecified osteoarthritis, unspecified site: Secondary | ICD-10-CM

## 2020-03-23 DIAGNOSIS — M6281 Muscle weakness (generalized): Secondary | ICD-10-CM | POA: Diagnosis not present

## 2020-03-23 DIAGNOSIS — S4412XA Injury of median nerve at upper arm level, left arm, initial encounter: Secondary | ICD-10-CM | POA: Diagnosis not present

## 2020-03-23 DIAGNOSIS — S4402XA Injury of ulnar nerve at upper arm level, left arm, initial encounter: Secondary | ICD-10-CM | POA: Diagnosis not present

## 2020-03-23 DIAGNOSIS — M25529 Pain in unspecified elbow: Secondary | ICD-10-CM | POA: Diagnosis not present

## 2020-03-26 DIAGNOSIS — Z23 Encounter for immunization: Secondary | ICD-10-CM | POA: Diagnosis not present

## 2020-03-29 DIAGNOSIS — S4402XA Injury of ulnar nerve at upper arm level, left arm, initial encounter: Secondary | ICD-10-CM | POA: Diagnosis not present

## 2020-03-29 DIAGNOSIS — M25529 Pain in unspecified elbow: Secondary | ICD-10-CM | POA: Diagnosis not present

## 2020-03-29 DIAGNOSIS — S4412XA Injury of median nerve at upper arm level, left arm, initial encounter: Secondary | ICD-10-CM | POA: Diagnosis not present

## 2020-03-29 DIAGNOSIS — M6281 Muscle weakness (generalized): Secondary | ICD-10-CM | POA: Diagnosis not present

## 2020-03-31 DIAGNOSIS — M25529 Pain in unspecified elbow: Secondary | ICD-10-CM | POA: Diagnosis not present

## 2020-03-31 DIAGNOSIS — S4402XA Injury of ulnar nerve at upper arm level, left arm, initial encounter: Secondary | ICD-10-CM | POA: Diagnosis not present

## 2020-03-31 DIAGNOSIS — M6281 Muscle weakness (generalized): Secondary | ICD-10-CM | POA: Diagnosis not present

## 2020-03-31 DIAGNOSIS — S4412XA Injury of median nerve at upper arm level, left arm, initial encounter: Secondary | ICD-10-CM | POA: Diagnosis not present

## 2020-04-04 DIAGNOSIS — S4412XA Injury of median nerve at upper arm level, left arm, initial encounter: Secondary | ICD-10-CM | POA: Diagnosis not present

## 2020-04-04 DIAGNOSIS — S4402XA Injury of ulnar nerve at upper arm level, left arm, initial encounter: Secondary | ICD-10-CM | POA: Diagnosis not present

## 2020-04-04 DIAGNOSIS — M25529 Pain in unspecified elbow: Secondary | ICD-10-CM | POA: Diagnosis not present

## 2020-04-04 DIAGNOSIS — M6281 Muscle weakness (generalized): Secondary | ICD-10-CM | POA: Diagnosis not present

## 2020-04-06 DIAGNOSIS — S4412XA Injury of median nerve at upper arm level, left arm, initial encounter: Secondary | ICD-10-CM | POA: Diagnosis not present

## 2020-04-06 DIAGNOSIS — M25529 Pain in unspecified elbow: Secondary | ICD-10-CM | POA: Diagnosis not present

## 2020-04-06 DIAGNOSIS — S4402XA Injury of ulnar nerve at upper arm level, left arm, initial encounter: Secondary | ICD-10-CM | POA: Diagnosis not present

## 2020-04-06 DIAGNOSIS — M6281 Muscle weakness (generalized): Secondary | ICD-10-CM | POA: Diagnosis not present

## 2020-04-11 DIAGNOSIS — M25529 Pain in unspecified elbow: Secondary | ICD-10-CM | POA: Diagnosis not present

## 2020-04-11 DIAGNOSIS — S4402XA Injury of ulnar nerve at upper arm level, left arm, initial encounter: Secondary | ICD-10-CM | POA: Diagnosis not present

## 2020-04-11 DIAGNOSIS — S4412XA Injury of median nerve at upper arm level, left arm, initial encounter: Secondary | ICD-10-CM | POA: Diagnosis not present

## 2020-04-11 DIAGNOSIS — M6281 Muscle weakness (generalized): Secondary | ICD-10-CM | POA: Diagnosis not present

## 2020-04-13 DIAGNOSIS — S4402XA Injury of ulnar nerve at upper arm level, left arm, initial encounter: Secondary | ICD-10-CM | POA: Diagnosis not present

## 2020-04-13 DIAGNOSIS — M25529 Pain in unspecified elbow: Secondary | ICD-10-CM | POA: Diagnosis not present

## 2020-04-13 DIAGNOSIS — S4412XA Injury of median nerve at upper arm level, left arm, initial encounter: Secondary | ICD-10-CM | POA: Diagnosis not present

## 2020-04-13 DIAGNOSIS — M6281 Muscle weakness (generalized): Secondary | ICD-10-CM | POA: Diagnosis not present

## 2020-04-20 DIAGNOSIS — S4412XA Injury of median nerve at upper arm level, left arm, initial encounter: Secondary | ICD-10-CM | POA: Diagnosis not present

## 2020-04-20 DIAGNOSIS — S4402XA Injury of ulnar nerve at upper arm level, left arm, initial encounter: Secondary | ICD-10-CM | POA: Diagnosis not present

## 2020-04-20 DIAGNOSIS — M25529 Pain in unspecified elbow: Secondary | ICD-10-CM | POA: Diagnosis not present

## 2020-04-20 DIAGNOSIS — M6281 Muscle weakness (generalized): Secondary | ICD-10-CM | POA: Diagnosis not present

## 2020-04-27 DIAGNOSIS — M19022 Primary osteoarthritis, left elbow: Secondary | ICD-10-CM | POA: Diagnosis not present

## 2020-05-03 DIAGNOSIS — M25529 Pain in unspecified elbow: Secondary | ICD-10-CM | POA: Diagnosis not present

## 2020-05-03 DIAGNOSIS — S4402XA Injury of ulnar nerve at upper arm level, left arm, initial encounter: Secondary | ICD-10-CM | POA: Diagnosis not present

## 2020-05-03 DIAGNOSIS — S4412XA Injury of median nerve at upper arm level, left arm, initial encounter: Secondary | ICD-10-CM | POA: Diagnosis not present

## 2020-05-03 DIAGNOSIS — M6281 Muscle weakness (generalized): Secondary | ICD-10-CM | POA: Diagnosis not present

## 2020-05-05 ENCOUNTER — Telehealth: Payer: Self-pay | Admitting: Internal Medicine

## 2020-05-05 ENCOUNTER — Encounter: Payer: Self-pay | Admitting: Internal Medicine

## 2020-05-05 NOTE — Telephone Encounter (Signed)
Hi Dr. Rush Landmark, this pt was scheduled for a colon with you but procedure was cancelled due to poor prep. He would like to continue his GI care with Dr. Carlean Purl. Are you ok with the switch? Thank you.

## 2020-05-05 NOTE — Telephone Encounter (Signed)
OK that I do his colonoscopy  In reschedule just write needs double prep

## 2020-05-05 NOTE — Telephone Encounter (Signed)
Ov scheduled on 06/30/20 at 8:50am. Pt experiencing GI sxs, prefers to see Dr. Carlean Purl.

## 2020-05-05 NOTE — Telephone Encounter (Signed)
I do not have an objection to it as long as Dr. Carlean Purl is OK with this. I never met him and his preparation was not appropriate for procedure on date he came in so, would make sure no matter who does his procedure that he follows a 2-day preparation and 1 week of Miralax daily to ensure adequacy. Thanks. GM

## 2020-06-02 ENCOUNTER — Ambulatory Visit
Admission: RE | Admit: 2020-06-02 | Discharge: 2020-06-02 | Disposition: A | Discharge status: Home / Self Care | Payer: PPO | Source: Ambulatory Visit | Attending: Internal Medicine | Admitting: Internal Medicine

## 2020-06-02 ENCOUNTER — Other Ambulatory Visit: Payer: Self-pay

## 2020-06-02 DIAGNOSIS — S22008A Other fracture of unspecified thoracic vertebra, initial encounter for closed fracture: Secondary | ICD-10-CM

## 2020-06-02 DIAGNOSIS — M1990 Unspecified osteoarthritis, unspecified site: Secondary | ICD-10-CM

## 2020-06-02 DIAGNOSIS — M85851 Other specified disorders of bone density and structure, right thigh: Secondary | ICD-10-CM | POA: Diagnosis not present

## 2020-06-02 DIAGNOSIS — M810 Age-related osteoporosis without current pathological fracture: Secondary | ICD-10-CM | POA: Diagnosis not present

## 2020-06-30 ENCOUNTER — Encounter: Payer: Self-pay | Admitting: Internal Medicine

## 2020-06-30 ENCOUNTER — Ambulatory Visit: Payer: PPO | Admitting: Internal Medicine

## 2020-06-30 VITALS — BP 140/70 | HR 53 | Ht 70.0 in | Wt 179.0 lb

## 2020-06-30 DIAGNOSIS — Z1211 Encounter for screening for malignant neoplasm of colon: Secondary | ICD-10-CM

## 2020-06-30 DIAGNOSIS — K5900 Constipation, unspecified: Secondary | ICD-10-CM | POA: Diagnosis not present

## 2020-06-30 MED ORDER — SUPREP BOWEL PREP KIT 17.5-3.13-1.6 GM/177ML PO SOLN: 1.0000 | ORAL | 0 refills | Status: DC

## 2020-06-30 NOTE — Patient Instructions (Signed)
You have been scheduled for a colonoscopy. Please follow written instructions given to you at your visit today.  Please pick up your prep supplies at the pharmacy within the next 1-3 days. If you use inhalers (even only as needed), please bring them with you on the day of your procedure.  Please start taking a dose of Miralax daily.  I appreciate the opportunity to care for you. Silvano Rusk, MD, [facility]

## 2020-06-30 NOTE — Progress Notes (Signed)
Philip Baker 70 y.o. 10/14/1950 [id number]  Assessment & Plan:   Encounter Diagnoses  Name Primary?  . Colon cancer screening Yes  . Constipation, unspecified constipation type     This is a bit strange, I suppose has bacterial flora could have been altered leading to slower transit constipation since his attempted colonoscopy prep.  Quite honestly I do not completely understand.  I still think we will put him in for a screening colonoscopy as the change in bowel habits was really related to this colonoscopy prep as best we can tell.  I have asked him to use MiraLAX daily, we will give him a MiraLAX purge the day before prep day and use Suprep for his colonoscopy in the hope that we can get an adequate colonoscopy prep for his screening colonoscopy.  I appreciate the opportunity to care for this patient. CC: Avva, Ravisankar, MD    Subjective:   Chief Complaint: Screening colonoscopy, constipation since attempted colonoscopy prep  HPI Patient is a relatively healthy 70 year old white man who presented for a routine screening colonoscopy this summer and was unable to prep adequately.  He took a MiraLAX prep it was slow to act he called our service and the on-call physician said just give it time and it will work but it never really produced a good bowel movement or cleanse so his procedure was canceled.  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  In the past he had a negative colonoscopy by Dr. Verl Blalock.  Since the colonoscopy prep that did not go so well he has had difficulty with defecation and constipation.  That is really a new thing.  There were no medication changes.  He took align for several months he tried Stage manager with some help he is increased fiber in his diet with cereal.  He has been using 3 Dulcolax stool softeners.  However he still does not move his bowels every day like  he used to and he has some incomplete defecation and straining at times.  He is also added several coffee drinks in the morning as well.  No bleeding no unintentional weight loss.  Maternal grandfather had colon cancer.  His GI review of systems is otherwise negative.    Wt Readings from Last 3 Encounters:  06/30/20 179 lb (81.2 kg)  12/08/19 176 lb (79.8 kg)  11/23/19 176 lb (79.8 kg)    No Known Allergies Current Meds  Medication Sig  . acetaminophen (TYLENOL) 325 MG tablet Take 650 mg by mouth every 6 (six) hours as needed.  Marland Kitchen aspirin 81 MG tablet Take 81 mg by mouth daily.  . diphenhydramine-acetaminophen (TYLENOL PM) 25-500 MG TABS tablet Take 1 tablet by mouth at bedtime as needed.  . docusate sodium (COLACE) 100 MG capsule Take 100 mg by mouth in the morning, at noon, and at bedtime.  . Na Sulfate-K Sulfate-Mg Sulf (SUPREP BOWEL PREP KIT) 17.5-3.13-1.6 GM/177ML SOLN Take 1 kit by mouth as directed. Apply Coupon=BIN: [id number] PCN: CN GROUP: [id number] ID: [id number]; NO prior authorization  . Pseudophed-Chlophedianol-GG 30-12.5-100 MG/5ML LIQD Take by mouth.  . rosuvastatin (CRESTOR) 10 MG tablet Take 10 mg by mouth daily.   Past Medical History:  Diagnosis Date  . Allergy   . Anxiety   . Arthritis   . Depression    Past Surgical History:  Procedure Laterality Date  . 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  He has 1 daughter.   No alcohol 6 caffeinated beverages daily no drug use or other tobacco or smoking.  He was a never smoker.   family history includes Colon cancer in his maternal grandfather; Lung cancer in his mother; Melanoma in his father.   Review of Systems He does have some arthritic joint pains otherwise review of systems is negative.  Objective:   Physical Exam BP 140/70   Pulse (!) 53   Ht 5' 10"  (1.778 m)   Wt 179  lb (81.2 kg)   BMI 25.68 kg/m  Well-developed well-nourished white man in no acute distress  eyes anicteric Lungs are clear Heart sounds are normal Abdomen is soft and nontender without again a megaly or mass Rectal exam shows a normal anoderm normal resting tone no mass prostate is flat, good voluntary anal squeeze and appropriate abdominal contraction and descent on simulated defecation

## 2020-07-13 ENCOUNTER — Other Ambulatory Visit: Payer: Self-pay

## 2020-07-13 ENCOUNTER — Encounter: Payer: Self-pay | Admitting: Internal Medicine

## 2020-07-13 ENCOUNTER — Ambulatory Visit (AMBULATORY_SURGERY_CENTER): Payer: PPO | Admitting: Internal Medicine

## 2020-07-13 VITALS — BP 110/71 | HR 61 | Temp 98.6°F | Resp 15 | Ht 70.0 in | Wt 179.0 lb

## 2020-07-13 DIAGNOSIS — D123 Benign neoplasm of transverse colon: Secondary | ICD-10-CM

## 2020-07-13 DIAGNOSIS — E7800 Pure hypercholesterolemia, unspecified: Secondary | ICD-10-CM | POA: Diagnosis not present

## 2020-07-13 DIAGNOSIS — Z1211 Encounter for screening for malignant neoplasm of colon: Secondary | ICD-10-CM | POA: Diagnosis not present

## 2020-07-13 MED ORDER — SODIUM CHLORIDE 0.9 % IV SOLN: 500.0000 mL | Freq: Once | INTRAVENOUS | Status: DC

## 2020-07-13 NOTE — Op Note (Signed)
[facility] Patient Name: Philip Baker Procedure Date: 07/13/2020 8:02 AM MRN: [id number] Endoscopist: Gatha Mayer , MD Age: 70 Referring MD:  Date of Birth: 08/31/1950 Gender: Male Account #: [id number] Procedure:                Colonoscopy Indications:              Screening for colorectal malignant neoplasm Medicines:                Propofol per Anesthesia, Monitored Anesthesia Care Procedure:                Pre-Anesthesia Assessment:                           - Prior to the procedure, a History and Physical                            was performed, and patient medications and                            allergies were reviewed. The patient's tolerance of                            previous anesthesia was also reviewed. The risks                            and benefits of the procedure and the sedation                            options and risks were discussed with the patient.                            All questions were answered, and informed consent                            was obtained. Prior Anticoagulants: The patient has                            taken no previous anticoagulant or antiplatelet                            agents. ASA Grade Assessment: II - A patient with                            mild systemic disease. After reviewing the risks                            and benefits, the patient was deemed in                            satisfactory condition to undergo the procedure.                           After obtaining informed consent, the colonoscope  was passed under direct vision. Throughout the                            procedure, the patient's blood pressure, pulse, and                            oxygen saturations were monitored continuously. The                            Colonoscope was introduced through the anus and                            advanced to the the cecum, identified by                             appendiceal orifice and ileocecal valve. The                            colonoscopy was somewhat difficult due to a                            redundant colon. Successful completion of the                            procedure was aided by using manual pressure and                            straightening and shortening the scope to obtain                            bowel loop reduction. The patient tolerated the                            procedure well. The quality of the bowel                            preparation was excellent. The ileocecal valve,                            appendiceal orifice, and rectum were photographed.                            The bowel preparation used was SUPREP via split                            dose instruction. A Miralax purge was given prior                            to prep day. Scope In: 8:07:59 AM Scope Out: 8:27:35 AM Scope Withdrawal Time: 0 hours 9 minutes 35 seconds  Total Procedure Duration: 0 hours 19 minutes 36 seconds  Findings:                 The perianal and digital rectal examinations were  normal. Pertinent negatives include normal prostate                            (size, shape, and consistency).                           A diminutive polyp was found in the transverse                            colon. The polyp was sessile. The polyp was removed                            with a cold snare. Resection and retrieval were                            complete. Verification of patient identification                            for the specimen was done. Estimated blood loss was                            minimal.                           The colon (entire examined portion) was redundant.                           The exam was otherwise without abnormality on                            direct and retroflexion views. Complications:            No immediate complications. Estimated Blood Loss:     Estimated blood  loss was minimal. Impression:               - One diminutive polyp in the transverse colon,                            removed with a cold snare. Resected and retrieved.                           - Redundant colon.                           - The examination was otherwise normal on direct                            and retroflexion views. Recommendation:           - Patient has a contact number available for                            emergencies. The signs and symptoms of potential                            delayed complications were discussed with the  patient. Return to normal activities tomorrow.                            Written discharge instructions were provided to the                            patient.                           - Resume previous diet.                           - Continue present medications.                           - No recommendation at this time regarding repeat                            colonoscopy due to age. Gatha Mayer, MD 07/13/2020 8:44:06 AM This report has been signed electronically.

## 2020-07-13 NOTE — Patient Instructions (Addendum)
I found and removed one tiny polyp. I am certain it is benign but will get it analyzed.  All else was ok and clean!  Colon is a bit redundant or floppy - some people built that way and can tend towards constipation.  Recommend continue the MiraLax if that is helping but you can decide if needed.  I will let you know pathology results and when to have another routine colonoscopy by mail and/or My Chart.  I appreciate the opportunity to care for you. Gatha Mayer, MD, FACG YOU HAD AN ENDOSCOPIC PROCEDURE TODAY AT [facility]:   Refer to the procedure report that was given to you for any specific questions about what was found during the examination.  If the procedure report does not answer your questions, please call your gastroenterologist to clarify.  If you requested that your care partner not be given the details of your procedure findings, then the procedure report has been included in a sealed envelope for you to review at your convenience later.  YOU SHOULD EXPECT: Some feelings of bloating in the abdomen. Passage of more gas than usual.  Walking can help get rid of the air that was put into your GI tract during the procedure and reduce the bloating. If you had a lower endoscopy (such as a colonoscopy or flexible sigmoidoscopy) you may notice spotting of blood in your stool or on the toilet paper. If you underwent a bowel prep for your procedure, you may not have a normal bowel movement for a few days.  Please Note:  You might notice some irritation and congestion in your nose or some drainage.  This is from the oxygen used during your procedure.  There is no need for concern and it should clear up in a day or so.  SYMPTOMS TO REPORT IMMEDIATELY:   Following lower endoscopy (colonoscopy or flexible sigmoidoscopy):  Excessive amounts of blood in the stool  Significant tenderness or worsening of abdominal pains  Swelling of the abdomen that is new, acute  Fever of  100F or higher   For urgent or emergent issues, a gastroenterologist can be reached at any hour by calling [phone]. Do not use MyChart messaging for urgent concerns.    DIET:  We do recommend a small meal at first, but then you may proceed to your regular diet.  Drink plenty of fluids but you should avoid alcoholic beverages for 24 hours.  MEDICATIONS: Continue present medications.  Please see handouts given to you by your recovery nurse.  ACTIVITY:  You should plan to take it easy for the rest of today and you should NOT DRIVE or use heavy machinery until tomorrow (because of the sedation medicines used during the test).    FOLLOW UP: Our staff will call the number listed on your records 48-72 hours following your procedure to check on you and address any questions or concerns that you may have regarding the information given to you following your procedure. If we do not reach you, we will leave a message.  We will attempt to reach you two times.  During this call, we will ask if you have developed any symptoms of COVID 19. If you develop any symptoms (ie: fever, flu-like symptoms, shortness of breath, cough etc.) before then, please call [phone].  If you test positive for Covid 19 in the 2 weeks post procedure, please call and report this information to [place].    If any biopsies were taken you  will be contacted by phone or by letter within the next 1-3 weeks.  Please call us at [phone] if you have not heard about the biopsies in 3 weeks.   Thank you for allowing [place] to provide for your healthcare needs today.   SIGNATURES/CONFIDENTIALITY: You and/or your care partner have signed paperwork which will be entered into your electronic medical record.  These signatures attest to the fact that that the information above on your After Visit Summary has been reviewed and is understood.  Full responsibility of the confidentiality of this discharge information lies with you and/or  your care-partner.

## 2020-07-13 NOTE — Progress Notes (Signed)
Vs by CW in adm 

## 2020-07-13 NOTE — Progress Notes (Signed)
Report given to PACU, vss 

## 2020-07-13 NOTE — Progress Notes (Signed)
Called to room to assist during endoscopic procedure.  Patient ID and intended procedure confirmed with present staff. Received instructions for my participation in the procedure from the performing physician.  

## 2020-07-15 ENCOUNTER — Telehealth: Payer: Self-pay

## 2020-07-15 NOTE — Telephone Encounter (Signed)
  Follow up Call-  Call back number 07/13/2020  Post procedure Call Back phone  # [phone]  Permission to leave phone message Yes  Some recent data might be hidden     Patient questions:  Do you have a fever, pain , or abdominal swelling? No. Pain Score  0 *  Have you tolerated food without any problems? Yes.    Have you been able to return to your normal activities? Yes.    Do you have any questions about your discharge instructions: Diet   No. Medications  No. Follow up visit  No.  Do you have questions or concerns about your Care? No.  Actions: * If pain score is 4 or above: No action needed, pain <4.  1. Have you developed a fever since your procedure? no  2.   Have you had an respiratory symptoms (SOB or cough) since your procedure? no  3.   Have you tested positive for COVID 19 since your procedure no  4.   Have you had any family members/close contacts diagnosed with the COVID 19 since your procedure?  no   If yes to any of these questions please route to Joylene John, RN and Joella Prince, RN

## 2020-07-20 ENCOUNTER — Encounter: Payer: Self-pay | Admitting: Internal Medicine

## 2020-08-04 DIAGNOSIS — H52203 Unspecified astigmatism, bilateral: Secondary | ICD-10-CM | POA: Diagnosis not present

## 2020-08-04 DIAGNOSIS — H524 Presbyopia: Secondary | ICD-10-CM | POA: Diagnosis not present

## 2020-08-04 DIAGNOSIS — Z961 Presence of intraocular lens: Secondary | ICD-10-CM | POA: Diagnosis not present

## 2020-08-04 DIAGNOSIS — H2513 Age-related nuclear cataract, bilateral: Secondary | ICD-10-CM | POA: Diagnosis not present

## 2020-09-22 DIAGNOSIS — Z125 Encounter for screening for malignant neoplasm of prostate: Secondary | ICD-10-CM | POA: Diagnosis not present

## 2020-09-22 DIAGNOSIS — E785 Hyperlipidemia, unspecified: Secondary | ICD-10-CM | POA: Diagnosis not present

## 2020-09-26 DIAGNOSIS — E785 Hyperlipidemia, unspecified: Secondary | ICD-10-CM | POA: Diagnosis not present

## 2020-09-26 DIAGNOSIS — G4700 Insomnia, unspecified: Secondary | ICD-10-CM | POA: Diagnosis not present

## 2020-09-26 DIAGNOSIS — J309 Allergic rhinitis, unspecified: Secondary | ICD-10-CM | POA: Diagnosis not present

## 2020-09-26 DIAGNOSIS — Z0000 Encounter for general adult medical examination without abnormal findings: Secondary | ICD-10-CM | POA: Diagnosis not present

## 2020-09-26 DIAGNOSIS — S22009A Unspecified fracture of unspecified thoracic vertebra, initial encounter for closed fracture: Secondary | ICD-10-CM | POA: Diagnosis not present

## 2020-09-26 DIAGNOSIS — F329 Major depressive disorder, single episode, unspecified: Secondary | ICD-10-CM | POA: Diagnosis not present

## 2020-09-26 DIAGNOSIS — K439 Ventral hernia without obstruction or gangrene: Secondary | ICD-10-CM | POA: Diagnosis not present

## 2020-09-26 DIAGNOSIS — D72819 Decreased white blood cell count, unspecified: Secondary | ICD-10-CM | POA: Diagnosis not present

## 2020-09-26 DIAGNOSIS — M8580 Other specified disorders of bone density and structure, unspecified site: Secondary | ICD-10-CM | POA: Diagnosis not present

## 2020-09-26 DIAGNOSIS — H6123 Impacted cerumen, bilateral: Secondary | ICD-10-CM | POA: Diagnosis not present

## 2020-09-26 DIAGNOSIS — R82998 Other abnormal findings in urine: Secondary | ICD-10-CM | POA: Diagnosis not present

## 2020-09-26 DIAGNOSIS — Z1212 Encounter for screening for malignant neoplasm of rectum: Secondary | ICD-10-CM | POA: Diagnosis not present

## 2021-01-03 ENCOUNTER — Other Ambulatory Visit: Payer: Self-pay | Admitting: Surgery

## 2021-01-03 DIAGNOSIS — K4090 Unilateral inguinal hernia, without obstruction or gangrene, not specified as recurrent: Secondary | ICD-10-CM | POA: Diagnosis not present

## 2021-03-25 DIAGNOSIS — Z23 Encounter for immunization: Secondary | ICD-10-CM | POA: Diagnosis not present

## 2021-08-01 DIAGNOSIS — Z85828 Personal history of other malignant neoplasm of skin: Secondary | ICD-10-CM | POA: Diagnosis not present

## 2021-08-01 DIAGNOSIS — L821 Other seborrheic keratosis: Secondary | ICD-10-CM | POA: Diagnosis not present

## 2021-08-01 DIAGNOSIS — L570 Actinic keratosis: Secondary | ICD-10-CM | POA: Diagnosis not present

## 2021-08-17 DIAGNOSIS — Z961 Presence of intraocular lens: Secondary | ICD-10-CM | POA: Diagnosis not present

## 2021-08-17 DIAGNOSIS — H26492 Other secondary cataract, left eye: Secondary | ICD-10-CM | POA: Diagnosis not present

## 2021-08-17 DIAGNOSIS — H52203 Unspecified astigmatism, bilateral: Secondary | ICD-10-CM | POA: Diagnosis not present

## 2021-08-17 DIAGNOSIS — H2511 Age-related nuclear cataract, right eye: Secondary | ICD-10-CM | POA: Diagnosis not present

## 2021-09-07 DIAGNOSIS — H26492 Other secondary cataract, left eye: Secondary | ICD-10-CM | POA: Diagnosis not present

## 2021-10-03 ENCOUNTER — Telehealth: Payer: PPO | Admitting: Family Medicine

## 2021-10-03 DIAGNOSIS — B9689 Other specified bacterial agents as the cause of diseases classified elsewhere: Secondary | ICD-10-CM

## 2021-10-03 DIAGNOSIS — J019 Acute sinusitis, unspecified: Secondary | ICD-10-CM

## 2021-10-03 DIAGNOSIS — R059 Cough, unspecified: Secondary | ICD-10-CM

## 2021-10-03 MED ORDER — AMOXICILLIN-POT CLAVULANATE 875-125 MG PO TABS: 1.0000 | ORAL_TABLET | Freq: Two times a day (BID) | ORAL | 0 refills | Status: AC

## 2021-10-03 MED ORDER — BENZONATATE 100 MG PO CAPS: 100.0000 mg | ORAL_CAPSULE | Freq: Two times a day (BID) | ORAL | 0 refills | Status: AC | PRN

## 2021-10-03 NOTE — Progress Notes (Signed)
Duplicate visit ?Connection issues ? ?[place]  ? ? ?

## 2021-10-03 NOTE — Patient Instructions (Signed)

## 2021-10-03 NOTE — Progress Notes (Signed)
?Virtual Visit Consent  ? ?Philip Baker, you are scheduled for a virtual visit with a [facility] provider today.   ?  ?Just as with appointments in the office, your consent must be obtained to participate.  Your consent will be active for this visit and any virtual visit you may have with one of our providers in the next 365 days.   ?  ?If you have a MyChart account, a copy of this consent can be sent to you electronically.  All virtual visits are billed to your insurance company just like a traditional visit in the office.   ? ?As this is a virtual visit, video technology does not allow for your provider to perform a traditional examination.  This may limit your provider's ability to fully assess your condition.  If your provider identifies any concerns that need to be evaluated in person or the need to arrange testing (such as labs, EKG, etc.), we will make arrangements to do so.   ?  ?Although advances in technology are sophisticated, we cannot ensure that it will always work on either your end or our end.  If the connection with a video visit is poor, the visit may have to be switched to a telephone visit.  With either a video or telephone visit, we are not always able to ensure that we have a secure connection.    ? ?I need to obtain your verbal consent now.   Are you willing to proceed with your visit today?  ?  ?CAISEN MANGAS has provided verbal consent on 10/03/2021 for a virtual visit (video or telephone). ?  ?Perlie Mayo, NP  ? ?Date: 10/03/2021 2:12 PM ? ? ?Virtual Visit via Video Note  ? ?IPerlie Mayo, connected with  Philip Baker  ([id number], 1951/01/13) on 10/03/21 at  2:30 PM EDT by a video-enabled telemedicine application and verified that I am speaking with the correct person using two identifiers. ? ?Location: ?Patient: Virtual Visit Location Patient: Home ?Provider: Virtual Visit Location Provider: Home Office ?  ?I discussed the limitations of evaluation and management by telemedicine  and the availability of in person appointments. The patient expressed understanding and agreed to proceed.   ? ?History of Present Illness: ?Philip Baker is a 71 y.o. who identifies as a male who was assigned male at birth, and is being seen today for cough and congestion. Feelings like cough and congestion are related to allergies. Has been using Flonase early in the season, but a week ago he started having drainage-thick and yellow, coating of mouth, congestion in sinuses, and a cough. Occasional headache.  ?Still using Flonase and benadryl.  ? ?Denies chest pain, shortness of breath, fevers, chills, ear pain, of sore throat. ? ? ?Problems: There are no problems to display for this patient. ?  ?Allergies: No Known Allergies ?Medications:  ?Current Outpatient Medications:  ?  DENTA 5000 PLUS 1.1 % CREA dental cream, Take 1 application by mouth daily., Disp: , Rfl:  ?  diphenhydramine-acetaminophen (TYLENOL PM) 25-500 MG TABS tablet, Take 1 tablet by mouth at bedtime as needed., Disp: , Rfl:  ?  Pseudophed-Chlophedianol-GG 30-12.5-100 MG/5ML LIQD, Take by mouth., Disp: , Rfl:  ?  rosuvastatin (CRESTOR) 10 MG tablet, Take 10 mg by mouth daily., Disp: , Rfl:  ? ?Observations/Objective: ?Patient is well-developed, well-nourished in no acute distress.  ?Resting comfortably  at home.  ?Head is normocephalic, atraumatic.  ?No labored breathing.  ?Speech is clear and  coherent with logical content.  ?Patient is alert and oriented at baseline.  ? ? ?Assessment and Plan: ?1. Acute bacterial sinusitis ? ?- amoxicillin-clavulanate (AUGMENTIN) 875-125 MG tablet; Take 1 tablet by mouth 2 (two) times daily for 7 days.  Dispense: 14 tablet; Refill: 0 ?- benzonatate (TESSALON) 100 MG capsule; Take 1 capsule (100 mg total) by mouth 2 (two) times daily as needed for cough.  Dispense: 20 capsule; Refill: 0 ? ?S&S are consistent with sinus infection post allergies. ?Continue Flonase and saline spray ?Use of Benadryl as needed but  sparingly due to side effects  ? ?Augmentin to Tx infection, perles to help with cough ? ?Reviewed side effects, risks and benefits of medication.   ? ?Patient acknowledged agreement and understanding of the plan.  ? ? ?Follow Up Instructions: ?I discussed the assessment and treatment plan with the patient. The patient was provided an opportunity to ask questions and all were answered. The patient agreed with the plan and demonstrated an understanding of the instructions.  A copy of instructions were sent to the patient via MyChart unless otherwise noted below.  ? ? ?The patient was advised to call back or seek an in-person evaluation if the symptoms worsen or if the condition fails to improve as anticipated. ? ?Time:  ?I spent 15 minutes with the patient via telehealth technology discussing the above problems/concerns.   ? ?Perlie Mayo, NP ? ?

## 2021-10-10 DIAGNOSIS — E785 Hyperlipidemia, unspecified: Secondary | ICD-10-CM | POA: Diagnosis not present

## 2021-10-10 DIAGNOSIS — Z125 Encounter for screening for malignant neoplasm of prostate: Secondary | ICD-10-CM | POA: Diagnosis not present

## 2021-10-17 DIAGNOSIS — D72819 Decreased white blood cell count, unspecified: Secondary | ICD-10-CM | POA: Diagnosis not present

## 2021-10-17 DIAGNOSIS — G4700 Insomnia, unspecified: Secondary | ICD-10-CM | POA: Diagnosis not present

## 2021-10-17 DIAGNOSIS — J309 Allergic rhinitis, unspecified: Secondary | ICD-10-CM | POA: Diagnosis not present

## 2021-10-17 DIAGNOSIS — E785 Hyperlipidemia, unspecified: Secondary | ICD-10-CM | POA: Diagnosis not present

## 2021-10-17 DIAGNOSIS — Z0000 Encounter for general adult medical examination without abnormal findings: Secondary | ICD-10-CM | POA: Diagnosis not present

## 2021-10-17 DIAGNOSIS — M8580 Other specified disorders of bone density and structure, unspecified site: Secondary | ICD-10-CM | POA: Diagnosis not present

## 2021-10-17 DIAGNOSIS — R82998 Other abnormal findings in urine: Secondary | ICD-10-CM | POA: Diagnosis not present

## 2021-10-17 DIAGNOSIS — Z1212 Encounter for screening for malignant neoplasm of rectum: Secondary | ICD-10-CM | POA: Diagnosis not present

## 2021-10-17 DIAGNOSIS — F329 Major depressive disorder, single episode, unspecified: Secondary | ICD-10-CM | POA: Diagnosis not present

## 2021-10-17 DIAGNOSIS — K439 Ventral hernia without obstruction or gangrene: Secondary | ICD-10-CM | POA: Diagnosis not present

## 2021-10-17 DIAGNOSIS — H6123 Impacted cerumen, bilateral: Secondary | ICD-10-CM | POA: Diagnosis not present

## 2021-10-17 DIAGNOSIS — S22009A Unspecified fracture of unspecified thoracic vertebra, initial encounter for closed fracture: Secondary | ICD-10-CM | POA: Diagnosis not present

## 2022-04-07 DIAGNOSIS — Z23 Encounter for immunization: Secondary | ICD-10-CM | POA: Diagnosis not present

## 2022-08-20 DIAGNOSIS — H2511 Age-related nuclear cataract, right eye: Secondary | ICD-10-CM | POA: Diagnosis not present

## 2022-08-20 DIAGNOSIS — H25041 Posterior subcapsular polar age-related cataract, right eye: Secondary | ICD-10-CM | POA: Diagnosis not present

## 2022-08-20 DIAGNOSIS — H35412 Lattice degeneration of retina, left eye: Secondary | ICD-10-CM | POA: Diagnosis not present

## 2022-08-20 DIAGNOSIS — H5211 Myopia, right eye: Secondary | ICD-10-CM | POA: Diagnosis not present

## 2022-08-21 DIAGNOSIS — L570 Actinic keratosis: Secondary | ICD-10-CM | POA: Diagnosis not present

## 2022-08-21 DIAGNOSIS — L821 Other seborrheic keratosis: Secondary | ICD-10-CM | POA: Diagnosis not present

## 2022-08-21 DIAGNOSIS — M7130 Other bursal cyst, unspecified site: Secondary | ICD-10-CM | POA: Diagnosis not present

## 2022-08-21 DIAGNOSIS — Z85828 Personal history of other malignant neoplasm of skin: Secondary | ICD-10-CM | POA: Diagnosis not present

## 2022-10-30 DIAGNOSIS — Z125 Encounter for screening for malignant neoplasm of prostate: Secondary | ICD-10-CM | POA: Diagnosis not present

## 2022-10-30 DIAGNOSIS — E785 Hyperlipidemia, unspecified: Secondary | ICD-10-CM | POA: Diagnosis not present

## 2022-10-30 DIAGNOSIS — R7989 Other specified abnormal findings of blood chemistry: Secondary | ICD-10-CM | POA: Diagnosis not present

## 2022-10-30 DIAGNOSIS — N529 Male erectile dysfunction, unspecified: Secondary | ICD-10-CM | POA: Diagnosis not present

## 2022-10-30 DIAGNOSIS — D72819 Decreased white blood cell count, unspecified: Secondary | ICD-10-CM | POA: Diagnosis not present

## 2022-11-06 DIAGNOSIS — Z23 Encounter for immunization: Secondary | ICD-10-CM | POA: Diagnosis not present

## 2022-11-06 DIAGNOSIS — J309 Allergic rhinitis, unspecified: Secondary | ICD-10-CM | POA: Diagnosis not present

## 2022-11-06 DIAGNOSIS — M8580 Other specified disorders of bone density and structure, unspecified site: Secondary | ICD-10-CM | POA: Diagnosis not present

## 2022-11-06 DIAGNOSIS — Z1331 Encounter for screening for depression: Secondary | ICD-10-CM | POA: Diagnosis not present

## 2022-11-06 DIAGNOSIS — Z1212 Encounter for screening for malignant neoplasm of rectum: Secondary | ICD-10-CM | POA: Diagnosis not present

## 2022-11-06 DIAGNOSIS — H6123 Impacted cerumen, bilateral: Secondary | ICD-10-CM | POA: Diagnosis not present

## 2022-11-06 DIAGNOSIS — K439 Ventral hernia without obstruction or gangrene: Secondary | ICD-10-CM | POA: Diagnosis not present

## 2022-11-06 DIAGNOSIS — E785 Hyperlipidemia, unspecified: Secondary | ICD-10-CM | POA: Diagnosis not present

## 2022-11-06 DIAGNOSIS — F39 Unspecified mood [affective] disorder: Secondary | ICD-10-CM | POA: Diagnosis not present

## 2022-11-06 DIAGNOSIS — Z0000 Encounter for general adult medical examination without abnormal findings: Secondary | ICD-10-CM | POA: Diagnosis not present

## 2022-11-06 DIAGNOSIS — R82998 Other abnormal findings in urine: Secondary | ICD-10-CM | POA: Diagnosis not present

## 2022-11-06 DIAGNOSIS — G4700 Insomnia, unspecified: Secondary | ICD-10-CM | POA: Diagnosis not present

## 2022-11-06 DIAGNOSIS — Z1339 Encounter for screening examination for other mental health and behavioral disorders: Secondary | ICD-10-CM | POA: Diagnosis not present

## 2022-11-06 DIAGNOSIS — D72819 Decreased white blood cell count, unspecified: Secondary | ICD-10-CM | POA: Diagnosis not present

## 2023-01-21 DIAGNOSIS — S22009A Unspecified fracture of unspecified thoracic vertebra, initial encounter for closed fracture: Secondary | ICD-10-CM | POA: Diagnosis not present

## 2023-04-06 DIAGNOSIS — Z23 Encounter for immunization: Secondary | ICD-10-CM | POA: Diagnosis not present

## 2023-07-25 ENCOUNTER — Other Ambulatory Visit: Payer: Self-pay | Admitting: Surgery

## 2023-07-25 DIAGNOSIS — K4090 Unilateral inguinal hernia, without obstruction or gangrene, not specified as recurrent: Secondary | ICD-10-CM | POA: Diagnosis not present

## 2023-08-21 DIAGNOSIS — H25041 Posterior subcapsular polar age-related cataract, right eye: Secondary | ICD-10-CM | POA: Diagnosis not present

## 2023-08-21 DIAGNOSIS — H52203 Unspecified astigmatism, bilateral: Secondary | ICD-10-CM | POA: Diagnosis not present

## 2023-08-21 DIAGNOSIS — H5211 Myopia, right eye: Secondary | ICD-10-CM | POA: Diagnosis not present

## 2023-08-21 DIAGNOSIS — H2511 Age-related nuclear cataract, right eye: Secondary | ICD-10-CM | POA: Diagnosis not present

## 2023-10-22 DIAGNOSIS — Z961 Presence of intraocular lens: Secondary | ICD-10-CM | POA: Diagnosis not present

## 2023-10-22 DIAGNOSIS — H25041 Posterior subcapsular polar age-related cataract, right eye: Secondary | ICD-10-CM | POA: Diagnosis not present

## 2023-10-22 DIAGNOSIS — H25811 Combined forms of age-related cataract, right eye: Secondary | ICD-10-CM | POA: Diagnosis not present

## 2023-10-22 DIAGNOSIS — H2511 Age-related nuclear cataract, right eye: Secondary | ICD-10-CM | POA: Diagnosis not present

## 2023-10-22 DIAGNOSIS — H52201 Unspecified astigmatism, right eye: Secondary | ICD-10-CM | POA: Diagnosis not present

## 2023-11-08 DIAGNOSIS — Z125 Encounter for screening for malignant neoplasm of prostate: Secondary | ICD-10-CM | POA: Diagnosis not present

## 2023-11-08 DIAGNOSIS — Z1212 Encounter for screening for malignant neoplasm of rectum: Secondary | ICD-10-CM | POA: Diagnosis not present

## 2023-11-08 DIAGNOSIS — D72819 Decreased white blood cell count, unspecified: Secondary | ICD-10-CM | POA: Diagnosis not present

## 2023-11-08 DIAGNOSIS — Z79899 Other long term (current) drug therapy: Secondary | ICD-10-CM | POA: Diagnosis not present

## 2023-11-08 DIAGNOSIS — E785 Hyperlipidemia, unspecified: Secondary | ICD-10-CM | POA: Diagnosis not present

## 2023-11-13 DIAGNOSIS — Z1212 Encounter for screening for malignant neoplasm of rectum: Secondary | ICD-10-CM | POA: Diagnosis not present

## 2023-11-13 DIAGNOSIS — E785 Hyperlipidemia, unspecified: Secondary | ICD-10-CM | POA: Diagnosis not present

## 2023-11-15 DIAGNOSIS — K439 Ventral hernia without obstruction or gangrene: Secondary | ICD-10-CM | POA: Diagnosis not present

## 2023-11-15 DIAGNOSIS — S22009A Unspecified fracture of unspecified thoracic vertebra, initial encounter for closed fracture: Secondary | ICD-10-CM | POA: Diagnosis not present

## 2023-11-15 DIAGNOSIS — H6123 Impacted cerumen, bilateral: Secondary | ICD-10-CM | POA: Diagnosis not present

## 2023-11-15 DIAGNOSIS — F39 Unspecified mood [affective] disorder: Secondary | ICD-10-CM | POA: Diagnosis not present

## 2023-11-15 DIAGNOSIS — J309 Allergic rhinitis, unspecified: Secondary | ICD-10-CM | POA: Diagnosis not present

## 2023-11-15 DIAGNOSIS — Z1331 Encounter for screening for depression: Secondary | ICD-10-CM | POA: Diagnosis not present

## 2023-11-15 DIAGNOSIS — M8580 Other specified disorders of bone density and structure, unspecified site: Secondary | ICD-10-CM | POA: Diagnosis not present

## 2023-11-15 DIAGNOSIS — Z0000 Encounter for general adult medical examination without abnormal findings: Secondary | ICD-10-CM | POA: Diagnosis not present

## 2023-11-15 DIAGNOSIS — D72819 Decreased white blood cell count, unspecified: Secondary | ICD-10-CM | POA: Diagnosis not present

## 2023-11-15 DIAGNOSIS — Z1339 Encounter for screening examination for other mental health and behavioral disorders: Secondary | ICD-10-CM | POA: Diagnosis not present

## 2023-11-15 DIAGNOSIS — R82998 Other abnormal findings in urine: Secondary | ICD-10-CM | POA: Diagnosis not present

## 2023-11-15 DIAGNOSIS — G4700 Insomnia, unspecified: Secondary | ICD-10-CM | POA: Diagnosis not present

## 2023-11-15 DIAGNOSIS — E785 Hyperlipidemia, unspecified: Secondary | ICD-10-CM | POA: Diagnosis not present

## 2024-02-07 DIAGNOSIS — H31002 Unspecified chorioretinal scars, left eye: Secondary | ICD-10-CM | POA: Diagnosis not present

## 2024-02-07 DIAGNOSIS — Z961 Presence of intraocular lens: Secondary | ICD-10-CM | POA: Diagnosis not present

## 2024-02-07 DIAGNOSIS — H43812 Vitreous degeneration, left eye: Secondary | ICD-10-CM | POA: Diagnosis not present

## 2024-04-02 DIAGNOSIS — Z23 Encounter for immunization: Secondary | ICD-10-CM | POA: Diagnosis not present
